# Patient Record
Sex: Female | Born: 1950 | Race: Black or African American | Hispanic: No | Smoking: Former smoker
Health system: Southern US, Community
[De-identification: ages and names within clinical notes are randomized; demographics above are authoritative.]

## PROBLEM LIST (undated history)

## (undated) DIAGNOSIS — E119 Type 2 diabetes mellitus without complications: Secondary | ICD-10-CM

## (undated) DIAGNOSIS — D649 Anemia, unspecified: Secondary | ICD-10-CM

## (undated) DIAGNOSIS — F329 Major depressive disorder, single episode, unspecified: Secondary | ICD-10-CM

## (undated) DIAGNOSIS — F32A Depression, unspecified: Secondary | ICD-10-CM

## (undated) DIAGNOSIS — R269 Unspecified abnormalities of gait and mobility: Secondary | ICD-10-CM

## (undated) HISTORY — DX: Type 2 diabetes mellitus without complications: E11.9

## (undated) HISTORY — DX: Anemia, unspecified: D64.9

## (undated) HISTORY — DX: Major depressive disorder, single episode, unspecified: F32.9

## (undated) HISTORY — DX: Depression, unspecified: F32.A

## (undated) HISTORY — DX: Unspecified abnormalities of gait and mobility: R26.9

---

## 2017-10-29 ENCOUNTER — Other Ambulatory Visit: Payer: Self-pay

## 2017-10-29 ENCOUNTER — Ambulatory Visit (INDEPENDENT_AMBULATORY_CARE_PROVIDER_SITE_OTHER): Payer: Medicare Other | Admitting: Neurology

## 2017-10-29 ENCOUNTER — Encounter (INDEPENDENT_AMBULATORY_CARE_PROVIDER_SITE_OTHER): Payer: Self-pay

## 2017-10-29 ENCOUNTER — Encounter: Payer: Self-pay | Admitting: Neurology

## 2017-10-29 VITALS — BP 98/60 | HR 87 | Ht 65.0 in | Wt 162.0 lb

## 2017-10-29 DIAGNOSIS — R202 Paresthesia of skin: Secondary | ICD-10-CM | POA: Diagnosis not present

## 2017-10-29 DIAGNOSIS — E538 Deficiency of other specified B group vitamins: Secondary | ICD-10-CM

## 2017-10-29 DIAGNOSIS — R269 Unspecified abnormalities of gait and mobility: Secondary | ICD-10-CM | POA: Diagnosis not present

## 2017-10-29 HISTORY — DX: Unspecified abnormalities of gait and mobility: R26.9

## 2017-10-29 NOTE — Progress Notes (Signed)
Reason for visit: Gait disturbance  Referring physician: Dr. Philmore Paliean  Natalie Howard is a 66 y.o. female  History of present illness:  Ms. Natalie Howard is a 66 year old right-handed black female with a history of problems with discomfort in the feet that has been present since 2012.  The patient indicates that it hurts to put pressure on her feet, but she also describes a numbness in the distal portion of the feet associated with a cramping sensation.  The pain is present at all times, but it seems to be a bit worse on the left foot than the right.  The pain does not keep her awake at night.  She does report low back pain without pain radiating from the back down the leg on either side.  The patient denies any numbness or discomfort in the hands, she does have some occasional neck pain.  She denies issues controlling the bowels of the bladder.  The patient has a history of alcohol use, but she claims that she was consuming only about 1 alcoholic beverage a day.  The patient last fell in September 2018, she has had several falls over time, she reports a history of a syncopal event in 2013.  She recently has moved to this area.  The patient claims that she has had nerve conduction studies previously several years ago and was told that she had a neuropathy.  The patient is sent to this office for further evaluation.  Past Medical History:  Diagnosis Date  . Depression   . Diabetes (HCC)   . Gait abnormality 10/29/2017    History reviewed. No pertinent surgical history.  Family History  Problem Relation Age of Onset  . Diabetes Mother   . Cancer - Colon Father   . Diabetes Sister   . Heart disease Brother   . Renal Disease Brother     Social history:  reports that she has quit smoking. she has never used smokeless tobacco. She reports that she does not drink alcohol or use drugs.  Medications:  Prior to Admission medications   Medication Sig Start Date End Date Taking? Authorizing Provider    ammonium lactate (AMLACTIN) 12 % cream Apply 1 Bottle as needed topically for dry skin.   Yes [provider]  ASPIRIN 81 PO Take 1 tablet daily by mouth.   Yes [provider]  ibuprofen (ADVIL,MOTRIN) 600 MG tablet Take 600 mg every 6 (six) hours as needed by mouth.   Yes [provider]  metformin (FORTAMET) 500 MG (OSM) 24 hr tablet Take 500 mg daily with breakfast by mouth.   Yes [provider]  PARoxetine (PAXIL) 10 MG tablet Take 10 mg daily by mouth.   Yes [provider]      Allergies  Allergen Reactions  . Epitol [Carbamazepine]   . Gluten Meal   . Mold Extract [Trichophyton]   . Penicillins     ROS:  Out of a complete 14 system review of symptoms, the patient complains only of the following symptoms, and all other reviewed systems are negative.  Ringing in the ears Numbness, passing out Depression  Blood pressure 98/60, pulse 87, height 5\' 5"  (1.651 m), weight 162 lb (73.5 kg).  Physical Exam  General: The patient is alert and cooperative at the time of the examination.  Eyes: Pupils are equal, round, and reactive to light. Discs are flat bilaterally.  Neck: The neck is supple, no carotid bruits are noted.  Respiratory: The respiratory examination  is clear.  Cardiovascular: The cardiovascular examination reveals a regular rate and rhythm, no obvious murmurs or rubs are noted.  Skin: Extremities are without significant edema.  Neurologic Exam  Mental status: The patient is alert and oriented x 3 at the time of the examination. The patient has apparent normal recent and remote memory, with an apparently normal attention span and concentration ability.  Cranial nerves: Facial symmetry is present. There is a decrease in pinprick sensation on the left face and forehead as compared to the right, the patient splits the midline with vibration sensation on the forehead, decreased on the right. The strength of the facial  muscles and the muscles to head turning and shoulder shrug are normal bilaterally. Speech is well enunciated, no aphasia or dysarthria is noted. Extraocular movements are full. Visual fields are full. The tongue is midline, and the patient has symmetric elevation of the soft palate. No obvious hearing deficits are noted.  Motor: The motor testing reveals 5 over 5 strength of all 4 extremities. Good symmetric motor tone is noted throughout.  Sensory: Sensory testing is intact to pinprick, soft touch, vibration sensation, and position sense on all 4 extremities, with exception that there is a decreased pinprick sensation on the left arm as compared to the right, symmetric in the legs. No evidence of extinction is noted.  Coordination: Cerebellar testing reveals good finger-nose-finger and heel-to-shin bilaterally.  Gait and station: Gait is normal. Tandem gait is normal. Romberg is negative. No drift is seen.  Reflexes: Deep tendon reflexes are symmetric, but are depressed bilaterally. Toes are downgoing bilaterally.   Assessment/Plan:  1.  Reported gait disturbance  2.  Nonorganic sensory examination  3. Prediabetes, HgB A1C is 6.1  The patient does have a nonorganic sensory examination, she splits the midline with vibration sensation on the forehead.  The patient reports some troubles with falling, her clinical examination today shows that her gait is relatively normal.  She does have decreased reflexes in the lower extremities.  We will set patient up for blood work today, she will have nerve conduction studies on both legs and EMG on the left leg.  She will follow-up for the above study.  Marlan Palau. Keith Willis MD 10/29/2017 11:01 AM  [facility] [address] [place], [place] [id number]  Phone [phone] Fax [phone]

## 2017-10-29 NOTE — Patient Instructions (Signed)
   WE will get blood work today and get EMG and NCV evaluation to look at the nerve function of the legs.

## 2017-11-02 LAB — MULTIPLE MYELOMA PANEL, SERUM
ALPHA2 GLOB SERPL ELPH-MCNC: 0.7 g/dL (ref 0.4–1.0)
Albumin SerPl Elph-Mcnc: 4 g/dL (ref 2.9–4.4)
Albumin/Glob SerPl: 1.4 (ref 0.7–1.7)
Alpha 1: 0.2 g/dL (ref 0.0–0.4)
B-Globulin SerPl Elph-Mcnc: 1.1 g/dL (ref 0.7–1.3)
Gamma Glob SerPl Elph-Mcnc: 0.9 g/dL (ref 0.4–1.8)
Globulin, Total: 2.9 g/dL (ref 2.2–3.9)
IGA/IMMUNOGLOBULIN A, SERUM: 232 mg/dL (ref 87–352)
IGG (IMMUNOGLOBIN G), SERUM: 868 mg/dL (ref 700–1600)
IGM (IMMUNOGLOBULIN M), SRM: 72 mg/dL (ref 26–217)
TOTAL PROTEIN: 6.9 g/dL (ref 6.0–8.5)

## 2017-11-02 LAB — ANA W/REFLEX: Anti Nuclear Antibody(ANA): NEGATIVE

## 2017-11-02 LAB — HIV ANTIBODY (ROUTINE TESTING W REFLEX): HIV SCREEN 4TH GENERATION: NONREACTIVE

## 2017-11-02 LAB — RPR: RPR: NONREACTIVE

## 2017-11-02 LAB — ANGIOTENSIN CONVERTING ENZYME: Angio Convert Enzyme: 43 U/L (ref 14–82)

## 2017-11-02 LAB — SEDIMENTATION RATE: Sed Rate: 10 mm/hr (ref 0–40)

## 2017-11-02 LAB — VITAMIN B12: VITAMIN B 12: 489 pg/mL (ref 232–1245)

## 2017-11-02 LAB — B. BURGDORFI ANTIBODIES: Lyme IgG/IgM Ab: <0.91 {ISR} (ref 0.00–0.90)

## 2017-11-03 ENCOUNTER — Telehealth: Payer: Self-pay | Admitting: *Deleted

## 2017-11-03 NOTE — Telephone Encounter (Signed)
-----   Message from York Spanielharles K Willis, MD sent at 11/02/2017  5:29 PM EST -----   The blood work results are unremarkable. Please call the patient.  ----- Message ----- From: Nell RangeInterface, [facility] Lab Results In Sent: 10/30/2017   7:40 AM To: York Spanielharles K Willis, MD

## 2017-11-03 NOTE — Telephone Encounter (Signed)
LVM for pt about unremarkable labs per CW,MD note.  Reminded her of EMG/NCS appt on 11/08/17.  Gave [facility] phone number if she has further questions/concerns.

## 2017-11-08 ENCOUNTER — Ambulatory Visit (INDEPENDENT_AMBULATORY_CARE_PROVIDER_SITE_OTHER): Payer: Medicare Other | Admitting: Neurology

## 2017-11-08 ENCOUNTER — Encounter: Payer: Self-pay | Admitting: Neurology

## 2017-11-08 DIAGNOSIS — R202 Paresthesia of skin: Secondary | ICD-10-CM

## 2017-11-08 DIAGNOSIS — M79604 Pain in right leg: Secondary | ICD-10-CM

## 2017-11-08 DIAGNOSIS — M79605 Pain in left leg: Secondary | ICD-10-CM | POA: Diagnosis not present

## 2017-11-08 DIAGNOSIS — R269 Unspecified abnormalities of gait and mobility: Secondary | ICD-10-CM

## 2017-11-08 NOTE — Procedures (Signed)
     HISTORY:  Natalie FullingDenise Howard is a 66 year old patient with a history of low back pain and leg discomfort, tingling and discomfort in the toes the feet bilaterally.  The patient is being evaluated for a possible neuropathy or a lumbosacral radiculopathy.  NERVE CONDUCTION STUDIES:  Nerve conduction studies were performed on both lower extremities.  Distal motor latencies for the peroneal and posterior tibial nerves were within normal limits bilaterally, there is evidence of normal motor amplitudes for the peroneal nerves bilaterally but low motor amplitudes for the posterior tibial nerves bilaterally.  The nerve conduction velocities for the posterior tibial nerves are normal bilaterally, slow below the knee only for the peroneal nerves bilaterally.  The peroneal sensory latencies are normal bilaterally, the H reflex latencies are within normal limits bilaterally.  EMG STUDIES:  EMG study was performed on the left lower extremity:  The tibialis anterior muscle reveals 2 to 4K motor units with slightly reduced recruitment. No fibrillations or positive waves were seen. The peroneus tertius muscle reveals 2 to 5K motor units with decreased recruitment. No fibrillations or positive waves were seen. The medial gastrocnemius muscle reveals 1 to 3K motor units with full recruitment. No fibrillations or positive waves were seen. The vastus lateralis muscle reveals up to 10K motor units with decreased recruitment. No fibrillations or positive waves were seen. The iliopsoas muscle reveals 2 to 4K motor units with slightly decreased recruitment. No fibrillations or positive waves were seen. The biceps femoris muscle (long head) reveals 2 to 4K motor units with full recruitment. No fibrillations or positive waves were seen. The lumbosacral paraspinal muscles were tested at 3 levels, and revealed no abnormalities of insertional activity at all 3 levels tested. There was good  relaxation.   IMPRESSION:  Nerve conduction studies done on both lower extremities do not confirm the presence of a peripheral neuropathy.  There is some lowering of motor amplitudes for the posterior tibial nerves bilaterally and slight slowing for the peroneal nerves below the knees bilaterally.  The EMG evaluation of the left lower extremity suggests mild chronic stable signs of denervation suggestive of an overlying L4 and L5 radiculopathy.  Clinical correlation is required.  Marlan Palau. Keith Willis MD 11/08/2017 11:23 AM  [facility] [address] [place], [place] [id number]  Phone [phone] Fax [phone]

## 2017-11-08 NOTE — Progress Notes (Addendum)
The patient comes in for EMG and nerve conduction study evaluation.  No definite signs of a peripheral neuropathy are noted, but the patient may have a mild overlying chronic L4 and L5 radiculopathy.  The patient will be sent for MRI of the lumbar spine.    MNC    Nerve / Sites Muscle Latency Ref. Amplitude Ref. Rel Amp Segments Distance Velocity Ref. Area    ms ms mV mV %  cm m/s m/s mVms  L Peroneal - EDB     Ankle EDB 5.3 ?6.5 3.0 ?2.0 100 Ankle - EDB 9   4.1     Fib head EDB 14.0  1.3  44.8 Fib head - Ankle 37 42 ?44 2.5     Pop fossa EDB 15.9  0.8  60.1 Pop fossa - Fib head 10 53 ?44 1.8         Pop fossa - Ankle      R Peroneal - EDB     Ankle EDB 4.3 ?6.5 4.7 ?2.0 100 Ankle - EDB 9   10.6     Fib head EDB 13.2  3.3  69.4 Fib head - Ankle 38 43 ?44 8.1     Pop fossa EDB 15.1  2.6  79.6 Pop fossa - Fib head 10 55 ?44 7.7         Pop fossa - Ankle      L Tibial - AH     Ankle AH 5.1 ?5.8 2.6 ?4.0 100 Ankle - AH 9   4.4     Pop fossa AH 15.8  0.3  12.9 Pop fossa - Ankle 444 412 ?41 0.8  R Tibial - AH     Ankle AH 5.8 ?5.8 1.9 ?4.0 100 Ankle - AH 9   3.5     Pop fossa AH 15.9  2.1  107 Pop fossa - Ankle 42 42 ?41 4.0             SNC    Nerve / Sites Rec. Site Peak Lat Ref.  Amp Ref. Segments Distance    ms ms V V  cm  R Superficial peroneal - Ankle     Lat leg Ankle 3.5 ?4.4 8 ?6 Lat leg - Ankle 14  L Superficial peroneal - Ankle     Lat leg Ankle 3.3 ?4.4 0.45 ?6 Lat leg - Ankle 14         H Reflex    Nerve H Lat Lat Hmax   ms ms   Left Right Ref. Left Right Ref.  Tibial - S[place]us 34.2 34.4 ?35.0 36.9 22.2 ?35.0         EMG

## 2017-11-08 NOTE — Progress Notes (Signed)
Please refer to EMG and nerve conduction study procedure note. 

## 2017-12-27 ENCOUNTER — Ambulatory Visit
Admission: RE | Admit: 2017-12-27 | Discharge: 2017-12-27 | Disposition: A | Discharge status: Home / Self Care | Payer: Medicare Other | Source: Ambulatory Visit | Attending: Neurology | Admitting: Neurology

## 2017-12-27 DIAGNOSIS — R269 Unspecified abnormalities of gait and mobility: Secondary | ICD-10-CM

## 2017-12-27 DIAGNOSIS — M79605 Pain in left leg: Secondary | ICD-10-CM

## 2017-12-27 DIAGNOSIS — M79604 Pain in right leg: Secondary | ICD-10-CM | POA: Diagnosis not present

## 2017-12-27 DIAGNOSIS — R202 Paresthesia of skin: Secondary | ICD-10-CM

## 2017-12-28 ENCOUNTER — Telehealth: Payer: Self-pay | Admitting: Neurology

## 2017-12-28 NOTE — Telephone Encounter (Signed)
  I called the patient.  MRI of the low back is relatively unremarkable, minimal degenerative changes are seen, no evidence of spinal stenosis or nerve root impingement.  Nothing to explain the low back pain or pain down the legs.  The foot pain could potentially be related to a small fiber neuropathy, nerve conduction studies did not show evidence of a peripheral neuropathy.  If the patient believes that she needs something for the discomfort, we can initiate gabapentin or Lyrica.  She will call our office if desired.   MRI lumbar 12/27/17:  IMPRESSION:    This MRI of the lumbar spine without contrast shows mild multilevel degenerative changes as detailed above that did not lead to any nerve root compression. There is no spinal stenosis. There were no acute findings.

## 2018-01-13 DIAGNOSIS — E119 Type 2 diabetes mellitus without complications: Secondary | ICD-10-CM | POA: Insufficient documentation

## 2018-01-13 DIAGNOSIS — Z7984 Long term (current) use of oral hypoglycemic drugs: Secondary | ICD-10-CM | POA: Insufficient documentation

## 2018-01-13 DIAGNOSIS — H2513 Age-related nuclear cataract, bilateral: Secondary | ICD-10-CM | POA: Insufficient documentation

## 2018-01-13 DIAGNOSIS — H43813 Vitreous degeneration, bilateral: Secondary | ICD-10-CM | POA: Insufficient documentation

## 2018-01-13 DIAGNOSIS — H524 Presbyopia: Secondary | ICD-10-CM | POA: Insufficient documentation

## 2018-11-30 ENCOUNTER — Ambulatory Visit (INDEPENDENT_AMBULATORY_CARE_PROVIDER_SITE_OTHER): Payer: Medicare Other | Admitting: Neurology

## 2018-11-30 DIAGNOSIS — R55 Syncope and collapse: Secondary | ICD-10-CM

## 2018-11-30 NOTE — Procedures (Signed)
    History:  Natalie Howard is a 67 year old patient with a history of chronic low back pain.  The patient has had a history of syncope in 2013, she has had recent episodes of falling.  The patient is being evaluated for this issue.  This is a routine EEG.  No skull defects are noted.  Medications include aspirin, metformin, and Paxil.  EEG classification: Normal awake  Description of the recording: The background rhythms of this recording consists of a fairly well modulated medium amplitude alpha rhythm of 9 Hz that is reactive to eye opening and closure. As the record progresses, the patient appears to remain in the waking state throughout the recording. Photic stimulation was performed, resulting in a bilateral and symmetric photic driving response. Hyperventilation was also performed, resulting in a minimal buildup of the background rhythm activities without significant slowing seen. At no time during the recording does there appear to be evidence of spike or spike wave discharges or evidence of focal slowing. EKG monitor shows no evidence of cardiac rhythm abnormalities with a heart rate of 60.  Impression: This is a normal EEG recording in the waking state. No evidence of ictal or interictal discharges are seen.

## 2018-12-05 ENCOUNTER — Encounter: Payer: Self-pay | Admitting: Internal Medicine

## 2019-09-19 ENCOUNTER — Other Ambulatory Visit: Payer: Self-pay | Admitting: Family Medicine

## 2019-09-19 DIAGNOSIS — Z1231 Encounter for screening mammogram for malignant neoplasm of breast: Secondary | ICD-10-CM

## 2019-10-02 DIAGNOSIS — M659 Synovitis and tenosynovitis, unspecified: Secondary | ICD-10-CM | POA: Insufficient documentation

## 2019-10-02 DIAGNOSIS — M65931 Unspecified synovitis and tenosynovitis, right forearm: Secondary | ICD-10-CM | POA: Insufficient documentation

## 2019-11-14 ENCOUNTER — Other Ambulatory Visit: Payer: Self-pay

## 2019-11-14 ENCOUNTER — Ambulatory Visit
Admission: RE | Admit: 2019-11-14 | Discharge: 2019-11-14 | Disposition: A | Discharge status: Home / Self Care | Payer: Medicare Other | Source: Ambulatory Visit | Attending: Family Medicine | Admitting: Family Medicine

## 2019-11-14 DIAGNOSIS — Z1231 Encounter for screening mammogram for malignant neoplasm of breast: Secondary | ICD-10-CM

## 2019-12-18 IMAGING — MG DIGITAL SCREENING BILAT W/ TOMO W/ CAD
8 series · 8 of 24 positions shown · non-contrast
Comparison: Previous exam(s).

CLINICAL DATA: Screening.

EXAM:
DIGITAL SCREENING BILATERAL MAMMOGRAM WITH TOMO AND CAD

[R CC synth-2D]
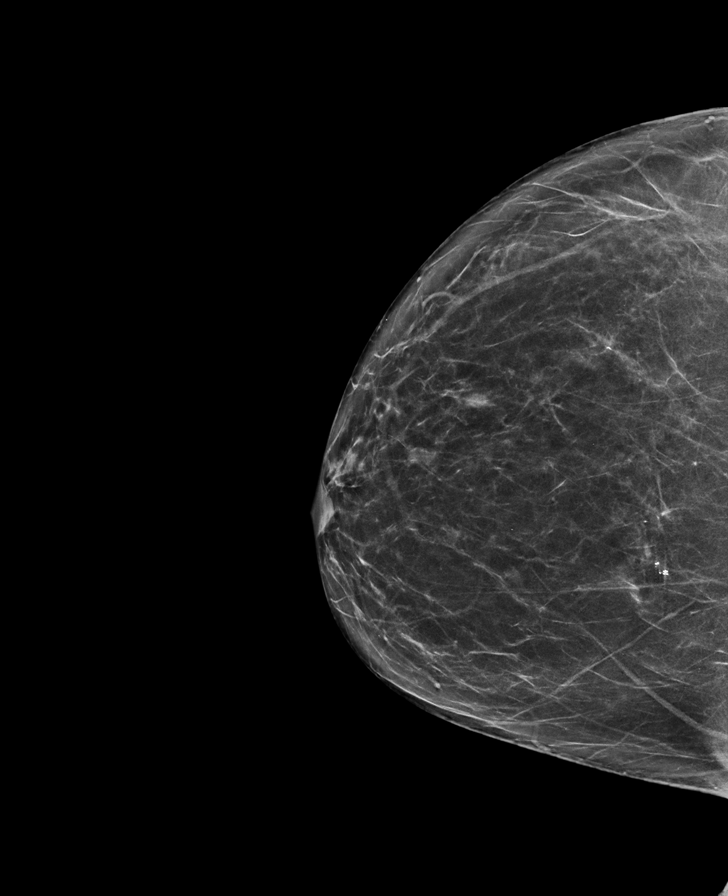

[L MLO synth-2D]
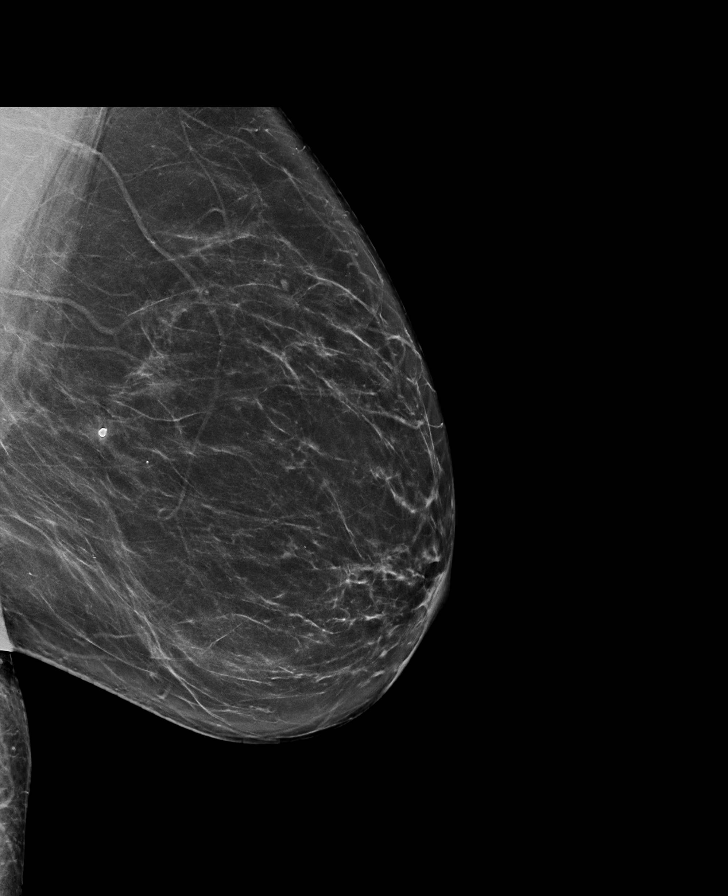

[L CC synth-2D]
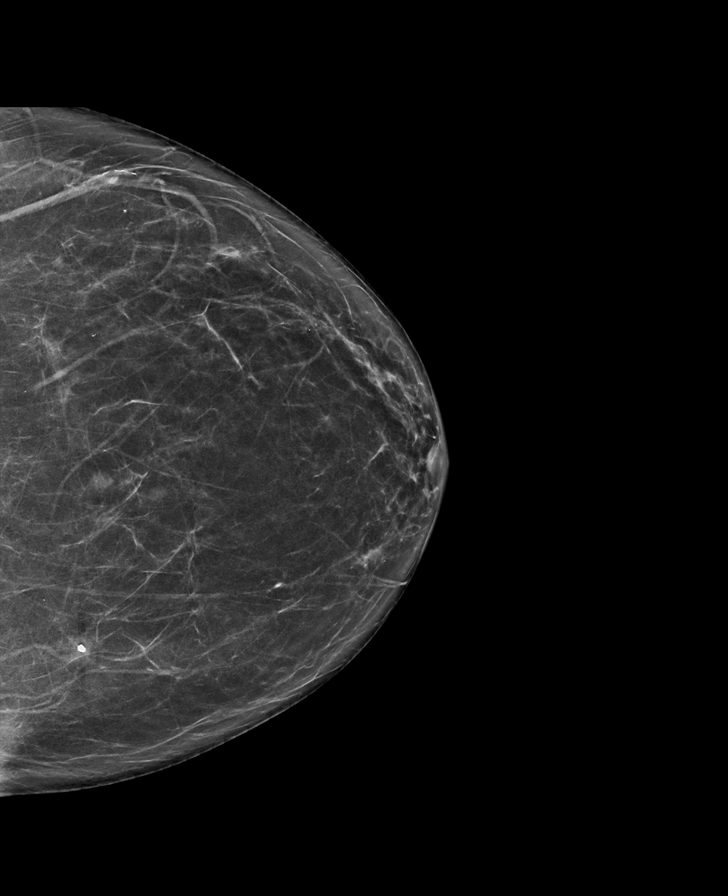

[R MLO synth-2D]
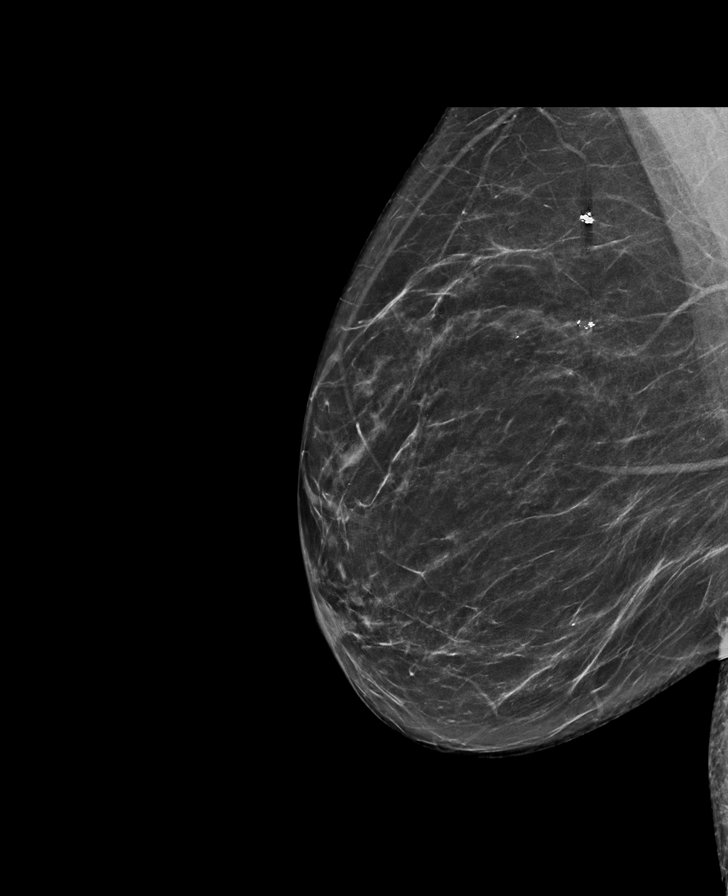

[R MLO tomo · tomo slice 37/72.0]
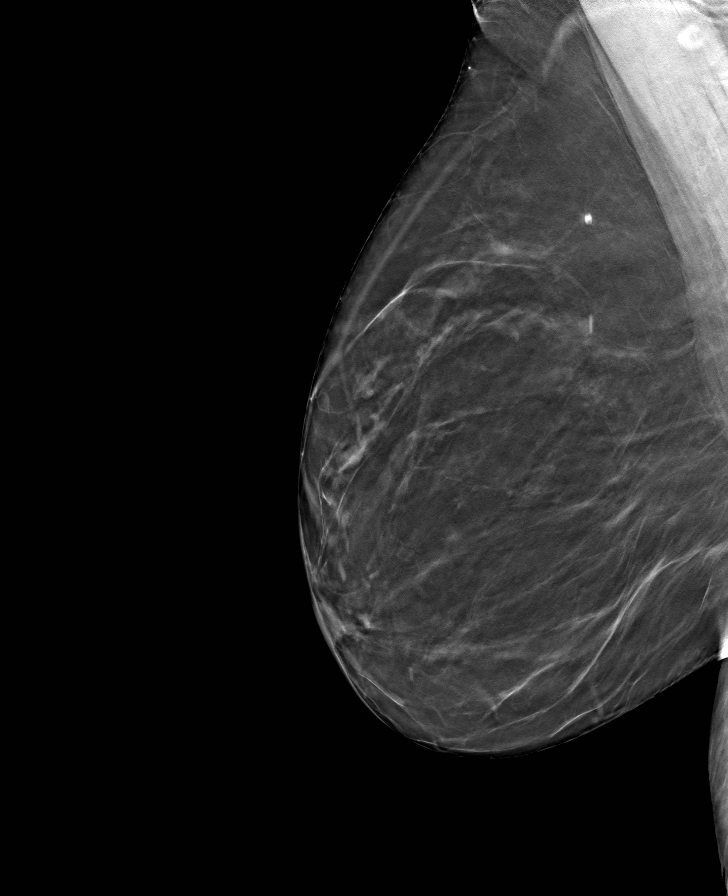

[L MLO tomo · tomo slice 40/79.0]
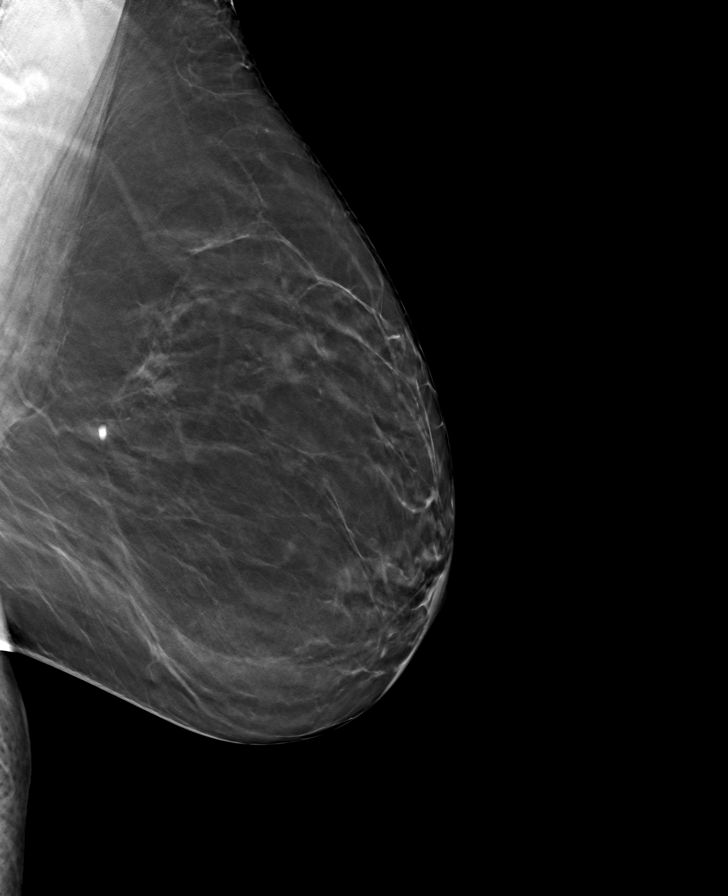

[R CC tomo · tomo slice 35/69.0]
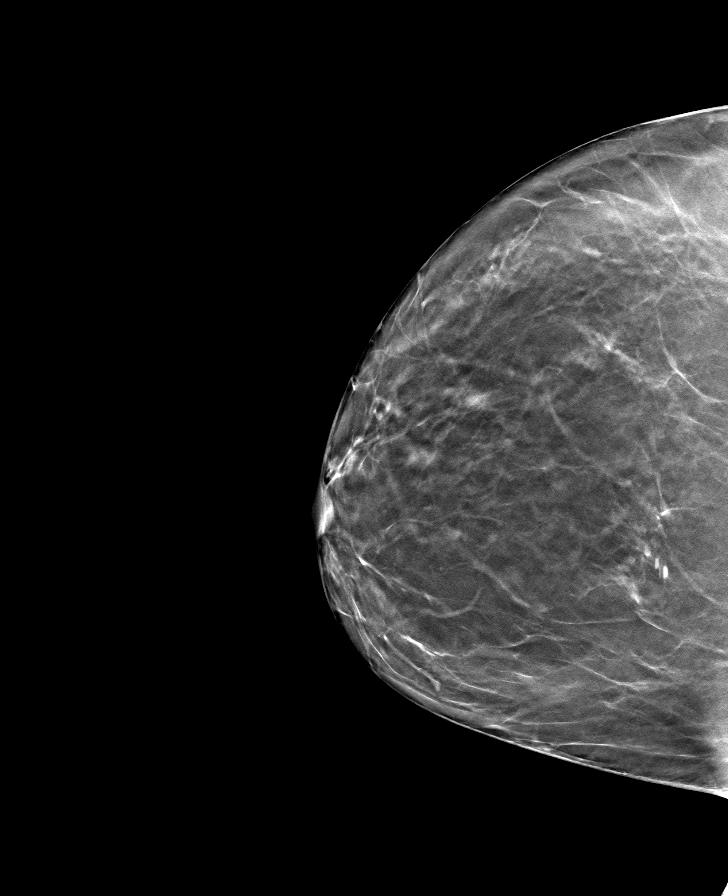

[L CC tomo · tomo slice 34/67.0]
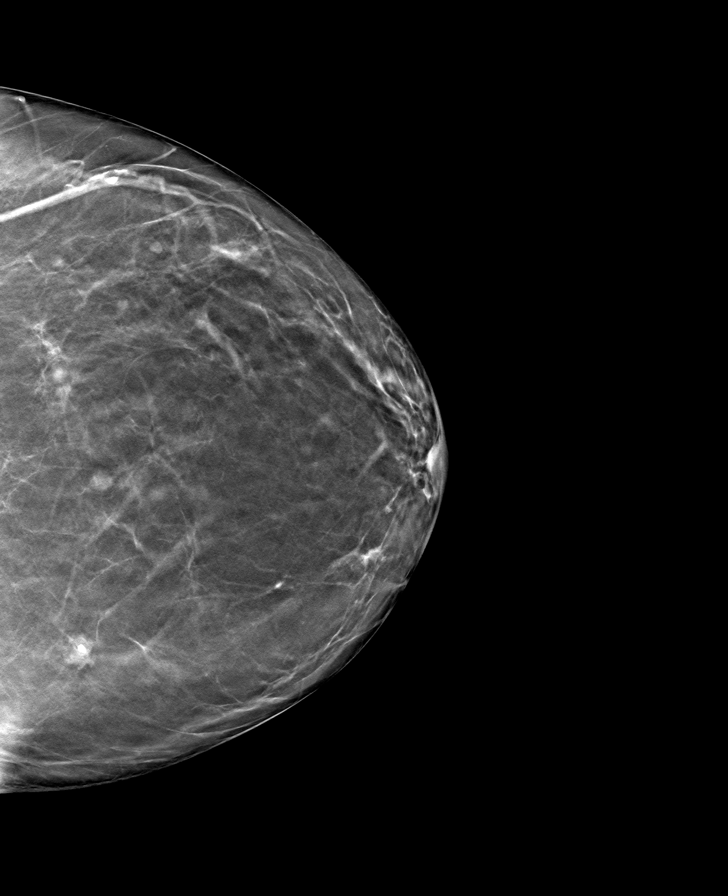

[8 of 24 positions shown; findings below may reference images not displayed]

ACR Breast Density Category b: There are scattered areas of
fibroglandular density.
FINDINGS: There are no findings suspicious for malignancy. Images were
processed with CAD.
IMPRESSION: No mammographic evidence of malignancy. A result letter of this
screening mammogram will be mailed directly to the patient.

RECOMMENDATION:
Screening mammogram in one year. (Code:[id number])

BI-RADS CATEGORY  1: Negative.

## 2020-04-25 ENCOUNTER — Inpatient Hospital Stay (HOSPITAL_BASED_OUTPATIENT_CLINIC_OR_DEPARTMENT_OTHER)
Admission: EM | Admit: 2020-04-25 | Discharge: 2020-05-01 | DRG: 378 | Disposition: A | Discharge status: Home / Self Care | Payer: Medicare Other | Attending: Family Medicine | Admitting: Family Medicine

## 2020-04-25 ENCOUNTER — Encounter (HOSPITAL_BASED_OUTPATIENT_CLINIC_OR_DEPARTMENT_OTHER): Payer: Self-pay | Admitting: *Deleted

## 2020-04-25 ENCOUNTER — Emergency Department (HOSPITAL_BASED_OUTPATIENT_CLINIC_OR_DEPARTMENT_OTHER): Payer: Medicare Other

## 2020-04-25 ENCOUNTER — Other Ambulatory Visit: Payer: Self-pay

## 2020-04-25 DIAGNOSIS — Z833 Family history of diabetes mellitus: Secondary | ICD-10-CM

## 2020-04-25 DIAGNOSIS — Z800 Family history of malignant neoplasm of digestive organs: Secondary | ICD-10-CM

## 2020-04-25 DIAGNOSIS — D62 Acute posthemorrhagic anemia: Secondary | ICD-10-CM | POA: Diagnosis present

## 2020-04-25 DIAGNOSIS — Z20822 Contact with and (suspected) exposure to covid-19: Secondary | ICD-10-CM | POA: Diagnosis present

## 2020-04-25 DIAGNOSIS — K5792 Diverticulitis of intestine, part unspecified, without perforation or abscess without bleeding: Secondary | ICD-10-CM | POA: Diagnosis present

## 2020-04-25 DIAGNOSIS — Z7984 Long term (current) use of oral hypoglycemic drugs: Secondary | ICD-10-CM

## 2020-04-25 DIAGNOSIS — K922 Gastrointestinal hemorrhage, unspecified: Secondary | ICD-10-CM | POA: Diagnosis present

## 2020-04-25 DIAGNOSIS — K5720 Diverticulitis of large intestine with perforation and abscess without bleeding: Secondary | ICD-10-CM

## 2020-04-25 DIAGNOSIS — K625 Hemorrhage of anus and rectum: Secondary | ICD-10-CM

## 2020-04-25 DIAGNOSIS — Z841 Family history of disorders of kidney and ureter: Secondary | ICD-10-CM

## 2020-04-25 DIAGNOSIS — Z8249 Family history of ischemic heart disease and other diseases of the circulatory system: Secondary | ICD-10-CM

## 2020-04-25 DIAGNOSIS — K529 Noninfective gastroenteritis and colitis, unspecified: Secondary | ICD-10-CM | POA: Diagnosis present

## 2020-04-25 DIAGNOSIS — K640 First degree hemorrhoids: Secondary | ICD-10-CM | POA: Diagnosis present

## 2020-04-25 DIAGNOSIS — K632 Fistula of intestine: Secondary | ICD-10-CM

## 2020-04-25 DIAGNOSIS — Z87891 Personal history of nicotine dependence: Secondary | ICD-10-CM

## 2020-04-25 DIAGNOSIS — Z888 Allergy status to other drugs, medicaments and biological substances status: Secondary | ICD-10-CM

## 2020-04-25 DIAGNOSIS — K5733 Diverticulitis of large intestine without perforation or abscess with bleeding: Secondary | ICD-10-CM | POA: Diagnosis not present

## 2020-04-25 DIAGNOSIS — Z7982 Long term (current) use of aspirin: Secondary | ICD-10-CM

## 2020-04-25 DIAGNOSIS — F419 Anxiety disorder, unspecified: Secondary | ICD-10-CM | POA: Diagnosis present

## 2020-04-25 DIAGNOSIS — Z880 Allergy status to penicillin: Secondary | ICD-10-CM

## 2020-04-25 DIAGNOSIS — E11649 Type 2 diabetes mellitus with hypoglycemia without coma: Secondary | ICD-10-CM | POA: Diagnosis not present

## 2020-04-25 DIAGNOSIS — Z79899 Other long term (current) drug therapy: Secondary | ICD-10-CM

## 2020-04-25 LAB — COMPREHENSIVE METABOLIC PANEL
ALT: 15 U/L (ref 0–44)
AST: 14 U/L — ABNORMAL LOW (ref 15–41)
Albumin: 3.9 g/dL (ref 3.5–5.0)
Alkaline Phosphatase: 91 U/L (ref 38–126)
Anion gap: 9 (ref 5–15)
BUN: 12 mg/dL (ref 8–23)
CO2: 25 mmol/L (ref 22–32)
Calcium: 9.1 mg/dL (ref 8.9–10.3)
Chloride: 108 mmol/L (ref 98–111)
Creatinine, Ser: 0.79 mg/dL (ref 0.44–1.00)
GFR calc Af Amer: >60 mL/min (ref >60)
GFR calc non Af Amer: >60 mL/min (ref >60)
Glucose, Bld: 127 mg/dL — ABNORMAL HIGH (ref 70–99)
Potassium: 3.9 mmol/L (ref 3.5–5.1)
Sodium: 142 mmol/L (ref 135–145)
Total Bilirubin: 0.5 mg/dL (ref 0.3–1.2)
Total Protein: 7.3 g/dL (ref 6.5–8.1)

## 2020-04-25 LAB — CBC
HCT: 35.1 % — ABNORMAL LOW (ref 36.0–46.0)
Hemoglobin: 11.5 g/dL — ABNORMAL LOW (ref 12.0–15.0)
MCH: 27.5 pg (ref 26.0–34.0)
MCHC: 32.8 g/dL (ref 30.0–36.0)
MCV: 84 fL (ref 80.0–100.0)
Platelets: 243 10*3/uL (ref 150–400)
RBC: 4.18 MIL/uL (ref 3.87–5.11)
RDW: 15.3 % (ref 11.5–15.5)
WBC: 5.5 10*3/uL (ref 4.0–10.5)
nRBC: 0 % (ref 0.0–0.2)

## 2020-04-25 LAB — OCCULT BLOOD X 1 CARD TO LAB, STOOL: Fecal Occult Bld: POSITIVE — AB

## 2020-04-25 NOTE — ED Notes (Signed)
Lying flat for orthostatics

## 2020-04-25 NOTE — ED Triage Notes (Addendum)
Abdominal pain and burgundy/bright red stools x 5 days. States the bleeding started after taking Simvastatin x 2. It has slowed since stopping the medication.

## 2020-04-26 ENCOUNTER — Encounter (HOSPITAL_BASED_OUTPATIENT_CLINIC_OR_DEPARTMENT_OTHER): Payer: Self-pay | Admitting: Emergency Medicine

## 2020-04-26 DIAGNOSIS — D62 Acute posthemorrhagic anemia: Secondary | ICD-10-CM | POA: Diagnosis present

## 2020-04-26 DIAGNOSIS — K625 Hemorrhage of anus and rectum: Secondary | ICD-10-CM | POA: Diagnosis present

## 2020-04-26 DIAGNOSIS — Z20822 Contact with and (suspected) exposure to covid-19: Secondary | ICD-10-CM | POA: Diagnosis present

## 2020-04-26 DIAGNOSIS — K5733 Diverticulitis of large intestine without perforation or abscess with bleeding: Secondary | ICD-10-CM | POA: Diagnosis present

## 2020-04-26 DIAGNOSIS — Z8249 Family history of ischemic heart disease and other diseases of the circulatory system: Secondary | ICD-10-CM | POA: Diagnosis not present

## 2020-04-26 DIAGNOSIS — Z7982 Long term (current) use of aspirin: Secondary | ICD-10-CM | POA: Diagnosis not present

## 2020-04-26 DIAGNOSIS — Z79899 Other long term (current) drug therapy: Secondary | ICD-10-CM | POA: Diagnosis not present

## 2020-04-26 DIAGNOSIS — Z7984 Long term (current) use of oral hypoglycemic drugs: Secondary | ICD-10-CM | POA: Diagnosis not present

## 2020-04-26 DIAGNOSIS — K640 First degree hemorrhoids: Secondary | ICD-10-CM | POA: Diagnosis present

## 2020-04-26 DIAGNOSIS — K5792 Diverticulitis of intestine, part unspecified, without perforation or abscess without bleeding: Secondary | ICD-10-CM

## 2020-04-26 DIAGNOSIS — K632 Fistula of intestine: Secondary | ICD-10-CM

## 2020-04-26 DIAGNOSIS — E11649 Type 2 diabetes mellitus with hypoglycemia without coma: Secondary | ICD-10-CM | POA: Diagnosis not present

## 2020-04-26 DIAGNOSIS — Z888 Allergy status to other drugs, medicaments and biological substances status: Secondary | ICD-10-CM | POA: Diagnosis not present

## 2020-04-26 DIAGNOSIS — K922 Gastrointestinal hemorrhage, unspecified: Secondary | ICD-10-CM | POA: Diagnosis present

## 2020-04-26 DIAGNOSIS — Z833 Family history of diabetes mellitus: Secondary | ICD-10-CM | POA: Diagnosis not present

## 2020-04-26 DIAGNOSIS — Z87891 Personal history of nicotine dependence: Secondary | ICD-10-CM | POA: Diagnosis not present

## 2020-04-26 DIAGNOSIS — Z800 Family history of malignant neoplasm of digestive organs: Secondary | ICD-10-CM | POA: Diagnosis not present

## 2020-04-26 DIAGNOSIS — K5720 Diverticulitis of large intestine with perforation and abscess without bleeding: Secondary | ICD-10-CM

## 2020-04-26 DIAGNOSIS — Z880 Allergy status to penicillin: Secondary | ICD-10-CM | POA: Diagnosis not present

## 2020-04-26 DIAGNOSIS — K529 Noninfective gastroenteritis and colitis, unspecified: Secondary | ICD-10-CM | POA: Diagnosis present

## 2020-04-26 DIAGNOSIS — Z841 Family history of disorders of kidney and ureter: Secondary | ICD-10-CM | POA: Diagnosis not present

## 2020-04-26 DIAGNOSIS — F419 Anxiety disorder, unspecified: Secondary | ICD-10-CM | POA: Diagnosis present

## 2020-04-26 LAB — SARS CORONAVIRUS 2 BY RT PCR (HOSPITAL ORDER, PERFORMED IN ~~LOC~~ HOSPITAL LAB): SARS Coronavirus 2: NEGATIVE

## 2020-04-26 LAB — BASIC METABOLIC PANEL
Anion gap: 6 (ref 5–15)
BUN: 10 mg/dL (ref 8–23)
CO2: 26 mmol/L (ref 22–32)
Calcium: 8.6 mg/dL — ABNORMAL LOW (ref 8.9–10.3)
Chloride: 110 mmol/L (ref 98–111)
Creatinine, Ser: 0.73 mg/dL (ref 0.44–1.00)
GFR calc Af Amer: >60 mL/min (ref >60)
GFR calc non Af Amer: >60 mL/min (ref >60)
Glucose, Bld: 82 mg/dL (ref 70–99)
Potassium: 4.3 mmol/L (ref 3.5–5.1)
Sodium: 142 mmol/L (ref 135–145)

## 2020-04-26 LAB — HEMOGLOBIN AND HEMATOCRIT, BLOOD
HCT: 32 % — ABNORMAL LOW (ref 36.0–46.0)
HCT: 30.8 % — ABNORMAL LOW (ref 36.0–46.0)
Hemoglobin: 10 g/dL — ABNORMAL LOW (ref 12.0–15.0)
Hemoglobin: 9.7 g/dL — ABNORMAL LOW (ref 12.0–15.0)

## 2020-04-26 LAB — GLUCOSE, CAPILLARY
Glucose-Capillary: 68 mg/dL — ABNORMAL LOW (ref 70–99)
Glucose-Capillary: 157 mg/dL — ABNORMAL HIGH (ref 70–99)
Glucose-Capillary: 62 mg/dL — ABNORMAL LOW (ref 70–99)

## 2020-04-26 MED ORDER — SODIUM CHLORIDE 0.9 % IV SOLN: INTRAVENOUS | Status: DC

## 2020-04-26 MED ORDER — IOHEXOL 300 MG/ML  SOLN
100.0000 mL | Freq: Once | INTRAMUSCULAR | Status: AC | PRN
  Administered 2020-04-26: 100 mL via INTRAVENOUS

## 2020-04-26 MED ORDER — PAROXETINE HCL 10 MG PO TABS
10.0000 mg | ORAL_TABLET | Freq: Every day | ORAL | Status: DC
  Administered 2020-04-26 – 2020-05-01 (×5): 10 mg via ORAL
  Filled 2020-04-26 (×6): qty 1

## 2020-04-26 MED ORDER — ONDANSETRON HCL 4 MG/2ML IJ SOLN
4.0000 mg | Freq: Once | INTRAMUSCULAR | Status: AC
  Administered 2020-04-26: 4 mg via INTRAVENOUS
  Filled 2020-04-26: qty 2

## 2020-04-26 MED ORDER — CIPROFLOXACIN IN D5W 400 MG/200ML IV SOLN
400.0000 mg | Freq: Two times a day (BID) | INTRAVENOUS | Status: DC
  Administered 2020-04-26 – 2020-05-01 (×10): 400 mg via INTRAVENOUS
  Filled 2020-04-26 (×10): qty 200

## 2020-04-26 MED ORDER — INSULIN ASPART 100 UNIT/ML ~~LOC~~ SOLN
0.0000 [IU] | Freq: Four times a day (QID) | SUBCUTANEOUS | Status: DC
  Administered 2020-04-29: 2 [IU] via SUBCUTANEOUS
  Administered 2020-05-01: 1 [IU] via SUBCUTANEOUS

## 2020-04-26 MED ORDER — INSULIN ASPART 100 UNIT/ML ~~LOC~~ SOLN: 0.0000 [IU] | Freq: Four times a day (QID) | SUBCUTANEOUS | Status: DC

## 2020-04-26 MED ORDER — METRONIDAZOLE IN NACL 5-0.79 MG/ML-% IV SOLN
500.0000 mg | Freq: Three times a day (TID) | INTRAVENOUS | Status: DC
  Administered 2020-04-26 – 2020-05-01 (×14): 500 mg via INTRAVENOUS
  Filled 2020-04-26 (×14): qty 100

## 2020-04-26 MED ORDER — DEXTROSE 50 % IV SOLN
12.5000 g | INTRAVENOUS | Status: AC
  Administered 2020-04-26: 12.5 g via INTRAVENOUS
  Filled 2020-04-26: qty 50

## 2020-04-26 MED ORDER — MORPHINE SULFATE (PF) 2 MG/ML IV SOLN
2.0000 mg | INTRAVENOUS | Status: DC | PRN
  Administered 2020-04-26 – 2020-05-01 (×29): 2 mg via INTRAVENOUS
  Filled 2020-04-26 (×29): qty 1

## 2020-04-26 MED ORDER — FENTANYL CITRATE (PF) 100 MCG/2ML IJ SOLN
50.0000 ug | Freq: Once | INTRAMUSCULAR | Status: AC
  Administered 2020-04-26: 50 ug via INTRAVENOUS
  Filled 2020-04-26: qty 2

## 2020-04-26 MED ORDER — MORPHINE SULFATE (PF) 2 MG/ML IV SOLN: 2.0000 mg | INTRAVENOUS | Status: DC | PRN

## 2020-04-26 MED ORDER — MORPHINE SULFATE (PF) 4 MG/ML IV SOLN
4.0000 mg | Freq: Once | INTRAVENOUS | Status: AC
  Administered 2020-04-26: 4 mg via INTRAVENOUS
  Filled 2020-04-26: qty 1

## 2020-04-26 MED ORDER — ACETAMINOPHEN 325 MG PO TABS
650.0000 mg | ORAL_TABLET | Freq: Four times a day (QID) | ORAL | Status: DC | PRN
  Administered 2020-04-27 – 2020-04-30 (×6): 650 mg via ORAL
  Filled 2020-04-26 (×6): qty 2

## 2020-04-26 MED ORDER — ACETAMINOPHEN 650 MG RE SUPP: 650.0000 mg | Freq: Four times a day (QID) | RECTAL | Status: DC | PRN

## 2020-04-26 NOTE — ED Notes (Signed)
Call placed to Daughter per pt request to update on care and report bed assignment to [facility] [id number]

## 2020-04-26 NOTE — Consult Note (Signed)
Natalie Howard 1951/01/09  [id number].    Requesting MD: Dr. Nicanor Alcon Chief Complaint/Reason for Consult: Colocolic fistula  HPI: Natalie Howard is a 69 y.o. female with a history of diabetes who presented to [facility] with abdominal pain, nausea and bloody stools.  Patient reports that last week she thought that she was having stomachaches.  She describes this as a intermittent, dull pain in her left lower quadrant.  She believes this was from a new medication that she had recently started.  On Saturday she began having a more severe, constant pain in her left lower quadrant that she describes as achiness with radiation of a severe pain to her left flank.  She notes that later that night she began having bowel movements with BRB.  She reports that the blood was mucousy and jellylike, streaked in the stool. No associated fever, chills, emesis, diarrhea, pneumaturia, flecks of stool and urine or other urinary symptoms.  She continued to have bloody bowel movements throughout the week which prompted her to present to the ED on the night of 5/13.  In the ED patient's vital signs are without fever, tachycardia, tachypnea, hypoxia or hypotension.  WBC 5.5.  Hemoglobin 11.5.  Fecal occult positive.  CT A/P showed abnormal wall thickening within a segment of the mid sigmoid colon, with evidence of underlying colocolic fistula. There was also borderline enlarged lymph nodes within the left external iliac distribution that was nonspecific.  Patient was admitted to the hospitalist service and transferred to [facility] for further evaluation.  We are asked to see.  Patient reports that her bloody BMs have almost resolved.  Her last bowel movement approximately 4 hours ago had only a small "fleck" of blood noted in it.  Her vital signs have been stable since admission and without any tachycardia or hypotension.  The patient denies history of diverticulitis in the past.  She denies history of  similar abdominal pain in the past or history of several days of abdominal pain that was not evaluated.  She reports that her last colonoscopy was 3-4 years ago and [place], [place] and she believes there is no abnormalities.  She denies any family history of Crohn's or ulcerative colitis.  She reports she thinks her father had colon cancer but is unsure of this.  Patient is not on blood thinners.  Prior abdominal surgeries include C-section and tubal ligation.  ROS: Review of Systems  Constitutional: Negative for chills and fever.  Respiratory: Negative for cough and shortness of breath.   Cardiovascular: Negative for chest pain and leg swelling.  Gastrointestinal: Positive for abdominal pain, blood in stool and nausea. Negative for constipation, diarrhea, melena and vomiting.  Genitourinary: Negative for dysuria.  Musculoskeletal: Positive for back pain.  Psychiatric/Behavioral: Negative for substance abuse.    Family History  Problem Relation Age of Onset  . Diabetes Mother   . Cancer - Colon Father   . Diabetes Sister   . Heart disease Brother   . Renal Disease Brother     Past Medical History:  Diagnosis Date  . Depression   . Diabetes (HCC)   . Gait abnormality 10/29/2017    History reviewed. No pertinent surgical history.  Social History:  reports that she has quit smoking. She has never used smokeless tobacco. She reports that she does not drink alcohol or use drugs.   Allergies:  Allergies  Allergen Reactions  . Epitol [Carbamazepine]   . Gluten Meal   . Mold Extract [  Trichophyton]   . Penicillins     Medications Prior to Admission  Medication Sig Dispense Refill  . ammonium lactate (AMLACTIN) 12 % cream Apply 1 Bottle as needed topically for dry skin.    . ASPIRIN 81 PO Take 1 tablet daily by mouth.    Marland Kitchen ibuprofen (ADVIL,MOTRIN) 600 MG tablet Take 600 mg every 6 (six) hours as needed by mouth.    . metformin (FORTAMET) 500 MG (OSM) 24 hr tablet Take 500 mg daily with  breakfast by mouth.    Marland Kitchen PARoxetine (PAXIL) 10 MG tablet Take 10 mg daily by mouth.       Physical Exam: Blood pressure 109/76, pulse 65, temperature 98.1 F (36.7 C), temperature source Oral, resp. rate 18, height 5\' 5"  (1.651 m), weight 73.5 kg, SpO2 100 %. General: pleasant, WD/WN female who is laying in bed in NAD HEENT: head is normocephalic, atraumatic.  Sclera are noninjected.  PERRL.  Ears and nose without any masses or lesions.  Mouth is pink and moist. Dentition fair Heart: regular, rate, and rhythm.  Normal s1,s2. No obvious murmurs, gallops, or rubs noted.  Palpable pedal pulses bilaterally  Lungs: CTAB, no wheezes, rhonchi, or rales noted.  Respiratory effort nonlabored Abd: Soft, ND, tenderness of the LLQ>LUQ without r/r/g. No peritonitis. +BS, no masses, hernias, or organomegaly MS: no BUE/BLE edema, calves soft and nontender Skin: warm and dry with no masses, lesions, or rashes Psych: A&Ox4 with an appropriate affect Neuro: cranial nerves grossly intact, equal strength in BUE/BLE bilaterally, normal speech, though process intact   Results for orders placed or performed during the hospital encounter of 04/25/20 (from the past 48 hour(s))  Comprehensive metabolic panel     Status: Abnormal   Collection Time: 04/25/20 10:31 PM  Result Value Ref Range   Sodium 142 135 - 145 mmol/L   Potassium 3.9 3.5 - 5.1 mmol/L   Chloride 108 98 - 111 mmol/L   CO2 25 22 - 32 mmol/L   Glucose, Bld 127 (H) 70 - 99 mg/dL    Comment: Glucose reference range applies only to samples taken after fasting for at least 8 hours.   BUN 12 8 - 23 mg/dL   Creatinine, Ser 04/27/20 0.44 - 1.00 mg/dL   Calcium 9.1 8.9 - 7.85 mg/dL   Total Protein 7.3 6.5 - 8.1 g/dL   Albumin 3.9 3.5 - 5.0 g/dL   AST 14 (L) 15 - 41 U/L   ALT 15 0 - 44 U/L   Alkaline Phosphatase 91 38 - 126 U/L   Total Bilirubin 0.5 0.3 - 1.2 mg/dL   GFR calc non Af Amer >60 >60 mL/min   GFR calc Af Amer >60 >60 mL/min   Anion gap 9 5  - 15    Comment: Performed at [facility], [address] [place], [place]  CBC     Status: Abnormal   Collection Time: 04/25/20 10:31 PM  Result Value Ref Range   WBC 5.5 4.0 - 10.5 K/uL   RBC 4.18 3.87 - 5.11 MIL/uL   Hemoglobin 11.5 (L) 12.0 - 15.0 g/dL   HCT 04/27/20 (L) 12.8 - 78.6 %   MCV 84.0 80.0 - 100.0 fL   MCH 27.5 26.0 - 34.0 pg   MCHC 32.8 30.0 - 36.0 g/dL   RDW 76.7 20.9 - 47.0 %   Platelets 243 150 - 400 K/uL   nRBC 0.0 0.0 - 0.2 %    Comment: Performed at  [facility], [address] [place], [place] [address]  Occult blood card to lab, stool     Status: Abnormal   Collection Time: 04/25/20 11:30 PM  Result Value Ref Range   Fecal Occult Bld POSITIVE (A) NEGATIVE    Comment: Performed at [facility], [address] [place], [place] [address]  SARS Coronavirus 2 by RT PCR (hospital order, performed in [facility] hospital lab) Nasopharyngeal Nasopharyngeal Swab     Status: None   Collection Time: 04/26/20 12:34 AM   Specimen: Nasopharyngeal Swab  Result Value Ref Range   SARS Coronavirus 2 NEGATIVE NEGATIVE    Comment: (NOTE) SARS-CoV-2 target nucleic acids are NOT DETECTED. The SARS-CoV-2 RNA is generally detectable in upper and lower respiratory specimens during the acute phase of infection. The lowest concentration of SARS-CoV-2 viral copies this assay can detect is 250 copies / mL. A negative result does not preclude SARS-CoV-2 infection and should not be used as the sole basis for treatment or other patient management decisions.  A negative result may occur with improper specimen collection / handling, submission of specimen other than nasopharyngeal swab, presence of viral mutation(s) within the areas targeted by this assay, and inadequate number of viral copies (<250 copies / mL). A negative result must be combined with clinical observations, patient history, and epidemiological information. Fact Sheet for  Patients:   StrictlyIdeas.no Fact Sheet for Healthcare Providers: BankingDealers.co.za This test is not yet approved or cleared  by the [place] FDA and has been authorized for detection and/or diagnosis of SARS-CoV-2 by FDA under an Emergency Use Authorization (EUA).  This EUA will remain in effect (meaning this test can be used) for the duration of the COVID-19 declaration under Section 564(b)(1) of the Act, 21 U.S.C. section 360bbb-3(b)(1), unless the authorization is terminated or revoked sooner. Performed at [facility], [address] [place], [place] [address]    CT ABDOMEN PELVIS W CONTRAST  Result Date: 04/26/2020 CLINICAL DATA:  Abdominal pain, bright red blood in stool for 5 days EXAM: CT ABDOMEN AND PELVIS WITH CONTRAST TECHNIQUE: Multidetector CT imaging of the abdomen and pelvis was performed using the standard protocol following bolus administration of intravenous contrast. CONTRAST:  180mL OMNIPAQUE IOHEXOL 300 MG/ML  SOLN COMPARISON:  None. FINDINGS: Lower chest: No acute pleural or parenchymal lung disease. Hepatobiliary: No focal liver abnormality is seen. No gallstones, gallbladder wall thickening, or biliary dilatation. Pancreas: Unremarkable. No pancreatic ductal dilatation or surrounding inflammatory changes. Spleen: Normal in size without focal abnormality. Adrenals/Urinary Tract: Adrenal glands are unremarkable. Kidneys are normal, without renal calculi, focal lesion, or hydronephrosis. Bladder is unremarkable. Stomach/Bowel: There is diffuse diverticulosis of the descending and sigmoid colon. There is eccentric wall thickening involving the anti mesenteric wall of the mid sigmoid colon. 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 This can best be seen on sagittal image 68. No bowel obstruction or ileus. Normal appendix is  seen in the right lower quadrant. Moderate stool throughout the colon. Vascular/Lymphatic: Mild aortic atherosclerosis is noted. There are 2 borderline enlarged lymph nodes within the left lower quadrant at the level of the left iliac bifurcation, reference image 49. Largest lymph node measures 9 mm. Reproductive: Uterus and bilateral adnexa are unremarkable. Other: There is no free fluid or free gas. No abdominal wall hernia. Musculoskeletal: No acute or destructive bony lesions. Reconstructed images demonstrate no additional findings. IMPRESSION:  1. Abnormal wall thickening within a segment of the mid sigmoid colon, with evidence of underlying colocolic fistula. The appearance could reflect sequela from prior bouts of diverticulitis, though colonoscopy is recommended to exclude underlying neoplasm. 2. Borderline enlarged lymph nodes within the left external iliac distribution, nonspecific. Electronically Signed   By: Sharlet Salina M.D.   On: 04/26/2020 00:28    Anti-infectives (From admission, onward)   Start     Dose/Rate Route Frequency Ordered Stop   04/26/20 1545  ciprofloxacin (CIPRO) IVPB 400 mg     400 mg 200 mL/hr over 60 Minutes Intravenous Every 12 hours 04/26/20 1541     04/26/20 1545  metroNIDAZOLE (FLAGYL) IVPB 500 mg     500 mg 100 mL/hr over 60 Minutes Intravenous Every 8 hours 04/26/20 1541        Assessment/Plan DM  Inflammation of the sigmoid colon on CT Colocolic fistula ABL Anemia -No indication for emergency surgery at this time -Hgb 11.5. VSS. She reports GI bleed is resolving -Etiology of CT scan is unclear at this time.  Likely from diverticulitis but other etiologies cannot be excluded.  Recommend GI consultation to determine if colonoscopy during hospitalization is appropriate -N.p.o., bowel rest, IV antibiotics -We will continue to follow.  FEN - NPO VTE - SCDs ID - Cipro/Flagyl  Jacinto Halim, [facility] 04/26/2020, 3:49 PM Please  see Amion for pager number during day hours 7:00am-4:30pm

## 2020-04-26 NOTE — ED Notes (Signed)
IV infiltrated during morphine administration however pt notes relief of pain. IV d/c'd and EDP notified.

## 2020-04-26 NOTE — ED Notes (Addendum)
Updated daughter Galena via telephone with patients permission. Daughter would like update via telephone when patient gets bed assignment.

## 2020-04-26 NOTE — ED Notes (Signed)
Dr Lynelle Doctor made aware of pt increase in pain, will place orders. Pt awaiting inpatient bed placement

## 2020-04-26 NOTE — ED Notes (Signed)
Pt more comfortable, awaiting inpatient bed assignment.

## 2020-04-26 NOTE — ED Notes (Signed)
Ambulated to bathroom with steady gait

## 2020-04-26 NOTE — H&P (Signed)
History and Physical    Natalie Howard ZOX:[id number] DOB: 1951-04-21 DOA: 04/25/2020  PCP: Gwenyth Bender, MD   [facility]: [facility]  Chief Complaint: rectal bleeding  HPI: Natalie Howard is a 69 y.o. female with medical history significant for patient, diabetes mellitus presented to the [facility] last night with complaint of nausea, abdominal pain and bright red/burgundy colored stool for 5 days.  Patient reports that bleeding started after taking simvastatin x2 and had associated abdominal pain on mid to left abdomen and radiation to back.  It was described dull in nature, which is more severe on Saturday and constant.  For the past year she has been having intermittent bouts of "stomach upset" with gas and abdominal discomfort mostly on the left side but was unable to localize until yesterday.  She had a colonoscopy about 3 years ago while she was at new work and apparently was normal. Patient otherwise denies any vomiting, chest pain, shortness of breath, fever, chills, headache, focal weakness, numbness tingling, speech difficulties.  In the ED, patient was hemodynamically stable H&H showed hemoglobin 11.5 g no prior labs for comparison, BMP normal, occult blood was positive, Covid 19 swab negative.  CT abdomen pelvis with contrast showed abnormal wall thickening within a segment of mid sigmoid colon with evidence of underlying colocolic fistula Dr. Carolynne Edouard from surgery was consulted and patient was transferred to [facility] for admission under medicine service. When I saw her in medical floor she is resting has some pain on mid epigastrium and left abdomen. She is on RA.  Review of Systems: All systems were reviewed and were negative except as mentioned in HPI above. Negative for fever Negative for chest pain Negative for shortness of breath  Past Medical History:  Diagnosis Date  . Depression   . Diabetes (HCC)   . Gait  abnormality 10/29/2017    History reviewed. No pertinent surgical history.   reports that she has quit smoking. She has never used smokeless tobacco. She reports that she does not drink alcohol or use drugs.  Allergies  Allergen Reactions  . Epitol [Carbamazepine]   . Gluten Meal   . Mold Extract [Trichophyton]   . Penicillins     Family History  Problem Relation Age of Onset  . Diabetes Mother   . Cancer - Colon Father   . Diabetes Sister   . Heart disease Brother   . Renal Disease Brother      Prior to Admission medications   Medication Sig Start Date End Date Taking? Authorizing Provider  ammonium lactate (AMLACTIN) 12 % cream Apply 1 Bottle as needed topically for dry skin.    [provider]  ASPIRIN 81 PO Take 1 tablet daily by mouth.    [provider]  ibuprofen (ADVIL,MOTRIN) 600 MG tablet Take 600 mg every 6 (six) hours as needed by mouth.    [provider]  metformin (FORTAMET) 500 MG (OSM) 24 hr tablet Take 500 mg daily with breakfast by mouth.    [provider]  PARoxetine (PAXIL) 10 MG tablet Take 10 mg daily by mouth.    [provider]    Physical Exam: Vitals:   04/26/20 1145 04/26/20 1254 04/26/20 1340 04/26/20 1434  BP:  122/74 114/75 109/76  Pulse: 71 64 63 65  Resp:  16 20 18   Temp:    98.1 F (36.7 C)  TempSrc:    Oral  SpO2: 99% 98%  100% 100%  Weight:      Height:        General exam: AAOx3, obese,NAD, weak appearing. HEENT:Oral mucosa moist, Ear/Nose WNL grossly, dentition normal. Respiratory system: bilaterally clear,no wheezing or crackles,no use of accessory muscle Cardiovascular system: S1 & S2 +, No JVD,. Gastrointestinal system: Abdomen soft,Tender to mid central/epigastric area and left abdomen,ND, BS+ Nervous System:Alert, awake, moving extremities and grossly nonfocal Extremities: No edema, distal peripheral pulses palpable.  Skin: No rashes,no icterus. MSK: Normal muscle bulk,tone,  power   Labs on Admission: I have personally reviewed following labs and imaging studies  CBC: Recent Labs  Lab 04/25/20 2231  WBC 5.5  HGB 11.5*  HCT 35.1*  MCV 84.0  PLT 623   Basic Metabolic Panel: Recent Labs  Lab 04/25/20 2231  NA 142  K 3.9  CL 108  CO2 25  GLUCOSE 127*  BUN 12  CREATININE 0.79  CALCIUM 9.1   GFR: Estimated Creatinine Clearance: 68.5 mL/min (by C-G formula based on SCr of 0.79 mg/dL). Liver Function Tests: Recent Labs  Lab 04/25/20 2231  AST 14*  ALT 15  ALKPHOS 91  BILITOT 0.5  PROT 7.3  ALBUMIN 3.9   No results for input(s): LIPASE, AMYLASE in the last 168 hours. No results for input(s): AMMONIA in the last 168 hours. Coagulation Profile: No results for input(s): INR, PROTIME in the last 168 hours. Cardiac Enzymes: No results for input(s): CKTOTAL, CKMB, CKMBINDEX, TROPONINI in the last 168 hours. BNP (last 3 results) No results for input(s): PROBNP in the last 8760 hours. HbA1C: No results for input(s): HGBA1C in the last 72 hours. CBG: No results for input(s): GLUCAP in the last 168 hours. Lipid Profile: No results for input(s): CHOL, HDL, LDLCALC, TRIG, CHOLHDL, LDLDIRECT in the last 72 hours. Thyroid Function Tests: No results for input(s): TSH, T4TOTAL, FREET4, T3FREE, THYROIDAB in the last 72 hours. Anemia Panel: No results for input(s): VITAMINB12, FOLATE, FERRITIN, TIBC, IRON, RETICCTPCT in the last 72 hours. Urine analysis: No results found for: COLORURINE, APPEARANCEUR, LABSPEC, PHURINE, GLUCOSEU, HGBUR, BILIRUBINUR, KETONESUR, PROTEINUR, UROBILINOGEN, NITRITE, LEUKOCYTESUR  Radiological Exams on Admission: CT ABDOMEN PELVIS W CONTRAST  Result Date: 04/26/2020 CLINICAL DATA:  Abdominal pain, bright red blood in stool for 5 days EXAM: CT ABDOMEN AND PELVIS WITH CONTRAST TECHNIQUE: Multidetector CT imaging of the abdomen and pelvis was performed using the standard protocol following bolus administration of intravenous  contrast. CONTRAST:  11mL OMNIPAQUE IOHEXOL 300 MG/ML  SOLN COMPARISON:  None. FINDINGS: Lower chest: No acute pleural or parenchymal lung disease. Hepatobiliary: No focal liver abnormality is seen. No gallstones, gallbladder wall thickening, or biliary dilatation. Pancreas: Unremarkable. No pancreatic ductal dilatation or surrounding inflammatory changes. Spleen: Normal in size without focal abnormality. Adrenals/Urinary Tract: Adrenal glands are unremarkable. Kidneys are normal, without renal calculi, focal lesion, or hydronephrosis. Bladder is unremarkable. Stomach/Bowel: There is diffuse diverticulosis of the descending and sigmoid colon. There is eccentric wall thickening involving the anti mesenteric wall of the mid sigmoid colon. 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 This can best be seen on sagittal image 68. No bowel obstruction or ileus. Normal appendix is seen in the right lower quadrant. Moderate stool throughout the colon. Vascular/Lymphatic: Mild aortic atherosclerosis is noted. There are 2 borderline enlarged lymph nodes within the left lower quadrant at the level of the left iliac bifurcation, reference image 49. Largest lymph node measures 9 mm. Reproductive: Uterus and bilateral adnexa  are unremarkable. Other: There is no free fluid or free gas. No abdominal wall hernia. Musculoskeletal: No acute or destructive bony lesions. Reconstructed images demonstrate no additional findings. IMPRESSION: 1. Abnormal wall thickening within a segment of the mid sigmoid colon, with evidence of underlying colocolic fistula. The appearance could reflect sequela from prior bouts of diverticulitis, though colonoscopy is recommended to exclude underlying neoplasm. 2. Borderline enlarged lymph nodes within the left external iliac distribution, nonspecific. Electronically Signed   By: Sharlet Salina M.D.   On: 04/26/2020 00:28     Assessment/Plan  Sigmoid colon deverticulitis with colocolinic fistula, CT scan shows abnormal wall thickening within the segment of the mid sigmoid colon with underlying colocolic fistula representing prior bouts of diverticulitis. CCS is on consult. Will start cipro/flagyl to treat sigmoid diverticulitis. CCS is on consult-recommends GI consultation to help for possible colonoscopy please consult GI in the morning.  N.p.o., continue IV morphine for pain control, continue Zofran.  Acute GI bleeding likely from diverticulitis:With CT abdomen showing colocolonic fistula and abnormal wall thickening within the segment of the mid sigmoid colon.H/H stable.Check serial H/H.  We will start the patient on Cipro and Flagyl.  She has penicillin allergy.  Npo with chips and meds, ivf, pain control with iv morphine. Hold aspirin/Lovenox or heparin.  Type 2 diabetes mellitus on insulin and Metformin.  Blood sugar 127 stable, check hemoglobin A1c. Hold oral medication add sliding scale insulin for now.  Anxiety on Paxil  Body mass index is 26.96 kg/m.   Severity of Illness: The appropriate patient status for this patient is inpatient. Inpatient status is judged to be reasonable and necessary in order to provide the required intensity of service to ensure the patient's safety. The patient's presenting symptoms, physical exam findings, and initial radiographic and laboratory data in the context of their medical condition is felt to place them at increased risk for further clinical deterioration. Furthermore, it is anticipated that the patient's length of stay will be more than 2 midnights due  to her diverticulitis colocolonic fistula, anemia and needing further service facility consultation and hemoglobin monitoring.   DVT prophylaxis:  SCD Code Status: Full code Family Communication:Admission, patients condition and plan of care including tests being ordered have been discussed with the patient who  indicate understanding and agree with the plan and Code Status.  Consults called:  CCS  Lanae Boast MD [facility]  If 7PM-7AM, please contact night-coverage [email or web address]  04/26/2020, 4:30 PM

## 2020-04-26 NOTE — ED Provider Notes (Signed)
[facility] EMERGENCY DEPARTMENT Provider Note   CSN: [id number] Arrival date & time: 04/25/20  2056     History Chief Complaint  Patient presents with  . Abdominal Pain  . Rectal Bleeding    Natalie Howard is a 69 y.o. female.  The history is provided by the patient.  Rectal Bleeding Quality:  Bright red Amount:  Moderate Duration:  7 days Timing:  Intermittent Chronicity:  New Context: defecation   Context: not anal fissures   Similar prior episodes: no   Relieved by:  Nothing Worsened by:  Nothing Ineffective treatments:  None tried Associated symptoms: abdominal pain   Associated symptoms: no dizziness, no fever, no loss of consciousness and no vomiting   Risk factors: no anticoagulant use and no hx of IBD   Patient presents with episodic rectal bleeding and lower abdominal pain since Saturday.  No f/c/r. No trauma.       Past Medical History:  Diagnosis Date  . Depression   . Diabetes (HCC)   . Gait abnormality 10/29/2017    Patient Active Problem List   Diagnosis Date Noted  . Gait abnormality 10/29/2017  . Paresthesia 10/29/2017    History reviewed. No pertinent surgical history.   OB History   No obstetric history on file.     Family History  Problem Relation Age of Onset  . Diabetes Mother   . Cancer - Colon Father   . Diabetes Sister   . Heart disease Brother   . Renal Disease Brother     Social History   Tobacco Use  . Smoking status: Former Games developer  . Smokeless tobacco: Never Used  Substance Use Topics  . Alcohol use: No    Comment: Hx alcohol abuse  . Drug use: No    Home Medications Prior to Admission medications   Medication Sig Start Date End Date Taking? Authorizing Provider  ammonium lactate (AMLACTIN) 12 % cream Apply 1 Bottle as needed topically for dry skin.    [provider]  ASPIRIN 81 PO Take 1 tablet daily by mouth.    [provider]  ibuprofen (ADVIL,MOTRIN) 600 MG tablet  Take 600 mg every 6 (six) hours as needed by mouth.    [provider]  metformin (FORTAMET) 500 MG (OSM) 24 hr tablet Take 500 mg daily with breakfast by mouth.    [provider]  PARoxetine (PAXIL) 10 MG tablet Take 10 mg daily by mouth.    [provider]    Allergies    Epitol [carbamazepine], Gluten meal, Mold extract [trichophyton], and Penicillins  Review of Systems   Review of Systems  Constitutional: Negative for fever.  HENT: Negative for congestion.   Eyes: Negative for visual disturbance.  Respiratory: Negative for shortness of breath.   Cardiovascular: Negative for chest pain.  Gastrointestinal: Positive for abdominal pain, anal bleeding, blood in stool and hematochezia. Negative for vomiting.  Genitourinary: Negative for dysuria.  Musculoskeletal: Negative for arthralgias.  Neurological: Negative for dizziness and loss of consciousness.  Psychiatric/Behavioral: Negative for agitation.  All other systems reviewed and are negative.   Physical Exam Updated Vital Signs BP 101/67 (BP Location: Right Arm)   Pulse 77   Temp 98.9 F (37.2 C) (Oral)   Resp 15   Ht 5\' 5"  (1.651 m)   Wt 73.5 kg   SpO2 100%   BMI 26.96 kg/m   Physical Exam Vitals and nursing note reviewed.  Constitutional:      General: She  is not in acute distress.    Appearance: She is well-developed.  HENT:     Head: Normocephalic and atraumatic.     Nose: Nose normal.  Eyes:     Conjunctiva/sclera: Conjunctivae normal.     Pupils: Pupils are equal, round, and reactive to light.  Cardiovascular:     Rate and Rhythm: Normal rate and regular rhythm.     Pulses: Normal pulses.     Heart sounds: Normal heart sounds.  Pulmonary:     Effort: Pulmonary effort is normal.     Breath sounds: Normal breath sounds.  Abdominal:     General: Abdomen is flat. Bowel sounds are normal. There is no distension.     Palpations: Abdomen is soft. There is no mass.     Tenderness:  There is no guarding or rebound.     Hernia: No hernia is present.  Genitourinary:    Rectum: Guaiac result positive.     Comments: Chaperone present Musculoskeletal:        General: Normal range of motion.     Cervical back: Normal range of motion and neck supple.  Skin:    General: Skin is warm and dry.     Capillary Refill: Capillary refill takes less than 2 seconds.  Neurological:     General: No focal deficit present.     Mental Status: She is alert and oriented to person, place, and time.     Deep Tendon Reflexes: Reflexes normal.  Psychiatric:        Mood and Affect: Mood normal.        Behavior: Behavior normal.     ED Results / Procedures / Treatments   Labs (all labs ordered are listed, but only abnormal results are displayed) Results for orders placed or performed during the hospital encounter of 04/25/20  SARS Coronavirus 2 by RT PCR (hospital order, performed in [facility] hospital lab) Nasopharyngeal Nasopharyngeal Swab   Specimen: Nasopharyngeal Swab  Result Value Ref Range   SARS Coronavirus 2 NEGATIVE NEGATIVE  Comprehensive metabolic panel  Result Value Ref Range   Sodium 142 135 - 145 mmol/L   Potassium 3.9 3.5 - 5.1 mmol/L   Chloride 108 98 - 111 mmol/L   CO2 25 22 - 32 mmol/L   Glucose, Bld 127 (H) 70 - 99 mg/dL   BUN 12 8 - 23 mg/dL   Creatinine, Ser 6.25 0.44 - 1.00 mg/dL   Calcium 9.1 8.9 - 63.8 mg/dL   Total Protein 7.3 6.5 - 8.1 g/dL   Albumin 3.9 3.5 - 5.0 g/dL   AST 14 (L) 15 - 41 U/L   ALT 15 0 - 44 U/L   Alkaline Phosphatase 91 38 - 126 U/L   Total Bilirubin 0.5 0.3 - 1.2 mg/dL   GFR calc non Af Amer >60 >60 mL/min   GFR calc Af Amer >60 >60 mL/min   Anion gap 9 5 - 15  CBC  Result Value Ref Range   WBC 5.5 4.0 - 10.5 K/uL   RBC 4.18 3.87 - 5.11 MIL/uL   Hemoglobin 11.5 (L) 12.0 - 15.0 g/dL   HCT 93.7 (L) 34.2 - 87.6 %   MCV 84.0 80.0 - 100.0 fL   MCH 27.5 26.0 - 34.0 pg   MCHC 32.8 30.0 - 36.0 g/dL   RDW 81.1 57.2 - 62.0 %    Platelets 243 150 - 400 K/uL   nRBC 0.0 0.0 - 0.2 %  Occult blood card to lab,  stool  Result Value Ref Range   Fecal Occult Bld POSITIVE (A) NEGATIVE   CT ABDOMEN PELVIS W CONTRAST  Result Date: 04/26/2020 CLINICAL DATA:  Abdominal pain, bright red blood in stool for 5 days EXAM: CT ABDOMEN AND PELVIS WITH CONTRAST TECHNIQUE: Multidetector CT imaging of the abdomen and pelvis was performed using the standard protocol following bolus administration of intravenous contrast. CONTRAST:  174mL OMNIPAQUE IOHEXOL 300 MG/ML  SOLN COMPARISON:  None. FINDINGS: Lower chest: No acute pleural or parenchymal lung disease. Hepatobiliary: No focal liver abnormality is seen. No gallstones, gallbladder wall thickening, or biliary dilatation. Pancreas: Unremarkable. No pancreatic ductal dilatation or surrounding inflammatory changes. Spleen: Normal in size without focal abnormality. Adrenals/Urinary Tract: Adrenal glands are unremarkable. Kidneys are normal, without renal calculi, focal lesion, or hydronephrosis. Bladder is unremarkable. Stomach/Bowel: There is diffuse diverticulosis of the descending and sigmoid colon. There is eccentric wall thickening involving the anti mesenteric wall of the mid sigmoid colon. 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 This can best be seen on sagittal image 68. No bowel obstruction or ileus. Normal appendix is seen in the right lower quadrant. Moderate stool throughout the colon. Vascular/Lymphatic: Mild aortic atherosclerosis is noted. There are 2 borderline enlarged lymph nodes within the left lower quadrant at the level of the left iliac bifurcation, reference image 49. Largest lymph node measures 9 mm. Reproductive: Uterus and bilateral adnexa are unremarkable. Other: There is no free fluid or free gas. No abdominal wall hernia. Musculoskeletal: No acute or destructive bony lesions. Reconstructed  images demonstrate no additional findings. IMPRESSION: 1. Abnormal wall thickening within a segment of the mid sigmoid colon, with evidence of underlying colocolic fistula. The appearance could reflect sequela from prior bouts of diverticulitis, though colonoscopy is recommended to exclude underlying neoplasm. 2. Borderline enlarged lymph nodes within the left external iliac distribution, nonspecific. Electronically Signed   By: Randa Ngo M.D.   On: 04/26/2020 00:28    Radiology CT ABDOMEN PELVIS W CONTRAST  Result Date: 04/26/2020 CLINICAL DATA:  Abdominal pain, bright red blood in stool for 5 days EXAM: CT ABDOMEN AND PELVIS WITH CONTRAST TECHNIQUE: Multidetector CT imaging of the abdomen and pelvis was performed using the standard protocol following bolus administration of intravenous contrast. CONTRAST:  123mL OMNIPAQUE IOHEXOL 300 MG/ML  SOLN COMPARISON:  None. FINDINGS: Lower chest: No acute pleural or parenchymal lung disease. Hepatobiliary: No focal liver abnormality is seen. No gallstones, gallbladder wall thickening, or biliary dilatation. Pancreas: Unremarkable. No pancreatic ductal dilatation or surrounding inflammatory changes. Spleen: Normal in size without focal abnormality. Adrenals/Urinary Tract: Adrenal glands are unremarkable. Kidneys are normal, without renal calculi, focal lesion, or hydronephrosis. Bladder is unremarkable. Stomach/Bowel: There is diffuse diverticulosis of the descending and sigmoid colon. There is eccentric wall thickening involving the anti mesenteric wall of the mid sigmoid colon. 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 This can best be seen on sagittal image 68. No bowel obstruction or ileus. Normal appendix is seen in the right lower quadrant. Moderate stool throughout the colon. Vascular/Lymphatic: Mild aortic atherosclerosis is noted. There are 2 borderline enlarged lymph  nodes within the left lower quadrant at the level of the left iliac bifurcation, reference image 49. Largest lymph node measures 9 mm. Reproductive: Uterus and bilateral adnexa are unremarkable. Other: There is no free fluid or free gas. No abdominal wall hernia. Musculoskeletal: No acute or  destructive bony lesions. Reconstructed images demonstrate no additional findings. IMPRESSION: 1. Abnormal wall thickening within a segment of the mid sigmoid colon, with evidence of underlying colocolic fistula. The appearance could reflect sequela from prior bouts of diverticulitis, though colonoscopy is recommended to exclude underlying neoplasm. 2. Borderline enlarged lymph nodes within the left external iliac distribution, nonspecific. Electronically Signed   By: Sharlet Salina M.D.   On: 04/26/2020 00:28    Procedures Procedures (including critical care time)  Medications Ordered in ED Medications  iohexol (OMNIPAQUE) 300 MG/ML solution 100 mL (100 mLs Intravenous Contrast Given 04/26/20 0009)  saline   ED Course  I have reviewed the triage vital signs and the nursing notes.  Pertinent labs & imaging results that were available during my care of the patient were reviewed by me and considered in my medical decision making (see chart for details).    1230 case d/w Dr. Carolynne Edouard.  No indication for surgical intervention at this time. Please have medicine consult post work up as necessary.     Final Clinical Impression(s) / ED Diagnoses Final diagnoses:  Rectal bleeding  Colonic fistula   Will admit to medicine for rectal bleeding and fistula.      Layan Zalenski, MD 04/26/20 [id number]

## 2020-04-27 DIAGNOSIS — K922 Gastrointestinal hemorrhage, unspecified: Secondary | ICD-10-CM

## 2020-04-27 DIAGNOSIS — K632 Fistula of intestine: Secondary | ICD-10-CM

## 2020-04-27 LAB — HEMOGLOBIN AND HEMATOCRIT, BLOOD
HCT: 29.4 % — ABNORMAL LOW (ref 36.0–46.0)
Hemoglobin: 9.5 g/dL — ABNORMAL LOW (ref 12.0–15.0)

## 2020-04-27 LAB — COMPREHENSIVE METABOLIC PANEL
ALT: 14 U/L (ref 0–44)
AST: 14 U/L — ABNORMAL LOW (ref 15–41)
Albumin: 3.5 g/dL (ref 3.5–5.0)
Alkaline Phosphatase: 82 U/L (ref 38–126)
Anion gap: 9 (ref 5–15)
BUN: 9 mg/dL (ref 8–23)
CO2: 23 mmol/L (ref 22–32)
Calcium: 8.3 mg/dL — ABNORMAL LOW (ref 8.9–10.3)
Chloride: 109 mmol/L (ref 98–111)
Creatinine, Ser: 0.85 mg/dL (ref 0.44–1.00)
GFR calc Af Amer: >60 mL/min (ref >60)
GFR calc non Af Amer: >60 mL/min (ref >60)
Glucose, Bld: 101 mg/dL — ABNORMAL HIGH (ref 70–99)
Potassium: 3.9 mmol/L (ref 3.5–5.1)
Sodium: 141 mmol/L (ref 135–145)
Total Bilirubin: 0.4 mg/dL (ref 0.3–1.2)
Total Protein: 6.5 g/dL (ref 6.5–8.1)

## 2020-04-27 LAB — CBC
HCT: 32.8 % — ABNORMAL LOW (ref 36.0–46.0)
Hemoglobin: 10.2 g/dL — ABNORMAL LOW (ref 12.0–15.0)
MCH: 26.8 pg (ref 26.0–34.0)
MCHC: 31.1 g/dL (ref 30.0–36.0)
MCV: 86.3 fL (ref 80.0–100.0)
Platelets: 221 10*3/uL (ref 150–400)
RBC: 3.8 MIL/uL — ABNORMAL LOW (ref 3.87–5.11)
RDW: 15.2 % (ref 11.5–15.5)
WBC: 6.2 10*3/uL (ref 4.0–10.5)
nRBC: 0 % (ref 0.0–0.2)

## 2020-04-27 LAB — GLUCOSE, CAPILLARY
Glucose-Capillary: 75 mg/dL (ref 70–99)
Glucose-Capillary: 80 mg/dL (ref 70–99)
Glucose-Capillary: 84 mg/dL (ref 70–99)
Glucose-Capillary: 99 mg/dL (ref 70–99)

## 2020-04-27 LAB — HEMOGLOBIN A1C
Hgb A1c MFr Bld: 6.4 % — ABNORMAL HIGH (ref 4.8–5.6)
Mean Plasma Glucose: 136.98 mg/dL

## 2020-04-27 LAB — HIV ANTIBODY (ROUTINE TESTING W REFLEX): HIV Screen 4th Generation wRfx: NONREACTIVE

## 2020-04-27 MED ORDER — SODIUM CHLORIDE 0.9 % IV SOLN: INTRAVENOUS | Status: DC

## 2020-04-27 NOTE — Progress Notes (Signed)
Subjective/Chief Complaint: Having abdominal pain this AM.  Generally tolerable IF she gets her pain meds on time, but if not, the pain starts to ramp up quickly.  Had a stool yesterday.    Has had a colonoscopy around 2-3 years ago in [place].     Objective: Vital signs in last 24 hours: Temp:  [98.1 F (36.7 C)-99 F (37.2 C)] 99 F (37.2 C) (05/15 0540) Pulse Rate:  [63-88] 88 (05/15 0540) Resp:  [14-20] 18 (05/15 0540) BP: (108-122)/(57-76) 111/69 (05/15 0540) SpO2:  [94 %-100 %] 94 % (05/15 0540) Last BM Date: 04/26/20  Intake/Output from previous day: 05/14 0701 - 05/15 0700 In: 2611.2 [I.V.:2011.1; IV Piggyback:600.1] Out: -  Intake/Output this shift: No intake/output data recorded.  General appearance: alert, cooperative and no distress Resp: breathing comfortably Cardio: RR&R GI: soft, mildly distended.  mildly tender in suprapubic region.  no HSM.  no r/g.   Extremities: extremities normal, atraumatic, no cyanosis or edema  Lab Results:  Recent Labs    04/25/20 2231 04/26/20 1600 04/26/20 2314 04/27/20 0820  WBC 5.5  --   --  6.2  HGB 11.5*   < > 9.7* 10.2*  HCT 35.1*   < > 30.8* 32.8*  PLT 243  --   --  221   < > = values in this interval not displayed.   BMET Recent Labs    04/25/20 2231 04/26/20 1600  NA 142 142  K 3.9 4.3  CL 108 110  CO2 25 26  GLUCOSE 127* 82  BUN 12 10  CREATININE 0.79 0.73  CALCIUM 9.1 8.6*   PT/INR No results for input(s): LABPROT, INR in the last 72 hours. ABG No results for input(s): PHART, HCO3 in the last 72 hours.  Invalid input(s): PCO2, PO2  Studies/Results: CT ABDOMEN PELVIS W CONTRAST  Result Date: 04/26/2020 CLINICAL DATA:  Abdominal pain, bright red blood in stool for 5 days EXAM: CT ABDOMEN AND PELVIS WITH CONTRAST TECHNIQUE: Multidetector CT imaging of the abdomen and pelvis was performed using the standard protocol following bolus administration of intravenous contrast. CONTRAST:  1105mL OMNIPAQUE  IOHEXOL 300 MG/ML  SOLN COMPARISON:  None. FINDINGS: Lower chest: No acute pleural or parenchymal lung disease. Hepatobiliary: No focal liver abnormality is seen. No gallstones, gallbladder wall thickening, or biliary dilatation. Pancreas: Unremarkable. No pancreatic ductal dilatation or surrounding inflammatory changes. Spleen: Normal in size without focal abnormality. Adrenals/Urinary Tract: Adrenal glands are unremarkable. Kidneys are normal, without renal calculi, focal lesion, or hydronephrosis. Bladder is unremarkable. Stomach/Bowel: There is diffuse diverticulosis of the descending and sigmoid colon. There is eccentric wall thickening involving the anti mesenteric wall of the mid sigmoid colon. 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 This can best be seen on sagittal image 68. No bowel obstruction or ileus. Normal appendix is seen in the right lower quadrant. Moderate stool throughout the colon. Vascular/Lymphatic: Mild aortic atherosclerosis is noted. There are 2 borderline enlarged lymph nodes within the left lower quadrant at the level of the left iliac bifurcation, reference image 49. Largest lymph node measures 9 mm. Reproductive: Uterus and bilateral adnexa are unremarkable. Other: There is no free fluid or free gas. No abdominal wall hernia. Musculoskeletal: No acute or destructive bony lesions. Reconstructed images demonstrate no additional findings. IMPRESSION: 1. Abnormal wall thickening within a segment of the mid sigmoid colon, with evidence of underlying colocolic fistula. The appearance could reflect sequela from prior  bouts of diverticulitis, though colonoscopy is recommended to exclude underlying neoplasm. 2. Borderline enlarged lymph nodes within the left external iliac distribution, nonspecific. Electronically Signed   By: Sharlet Salina M.D.   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  Sigmoid colitis.  IV fluids, NPO, IV antibiotics, pain control Rec GI consult for consideration of flex sig to eval source of bleeding and whether fistula is present.      LOS: 1 day    Almond Lint 04/27/2020

## 2020-04-27 NOTE — Progress Notes (Signed)
[facility]  PROGRESS NOTE  Natalie Howard QIW:[id number] DOB: 07-06-1951 DOA: 04/25/2020 PCP: Gwenyth Bender, MD   Brief HPI:   69 year old female with history of diabetes mellitus type 2, presents with complaints of abdominal pain, bright red burgundy colored stool for 5 days. In the ED CT abdomen pelvis showed abdominal wall thickening within a segment of mid sigmoid colon with evidence of underlying colocolic fistula. Dr. Carolynne Edouard was consulted and recommended transfer to [facility] under medicine service.    Subjective   Patient seen and examined, complains of left lower quadrant abdominal pain. Denies nausea or vomiting.   Assessment/Plan:     1. 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 General surgery was consulted, they are recommending sigmoidoscopy versus colonoscopy per GI to check for fistula and also rule out tumor. No plans for emergent surgery during this hospitalization. Will consult gastroenterology for further recommendations. 2. Acute GI bleeding likely from diverticulitis-CT abdomen showed colocolonic fistula with abdominal thickening. Patient started on Cipro and Flagyl. As needed IV morphine. Will hold chemical DVT prophylaxis due to GI bleed at this time. 3. Diabetes mellitus type 2-patient is sliding scale insulin. Metformin on hold. 4. Anxiety-continue Paxil.     SpO2: 97 %   COVID-19 Labs  No results for input(s): DDIMER, FERRITIN, LDH, CRP in the last 72 hours.  Lab Results  Component Value Date   SARSCOV2NAA NEGATIVE 04/26/2020     CBG: Recent Labs  Lab 04/26/20 1744 04/26/20 2024 04/27/20 0016 04/27/20 0650 04/27/20 1142  GLUCAP 157* 62* 84 99 80    CBC: Recent Labs  Lab 04/25/20 2231 04/26/20 1600 04/26/20 2314 04/27/20 0820  WBC 5.5  --   --  6.2  HGB  11.5* 10.0* 9.7* 10.2*  HCT 35.1* 32.0* 30.8* 32.8*  MCV 84.0  --   --  86.3  PLT 243  --   --  221    Basic Metabolic Panel: Recent Labs  Lab 04/25/20 2231 04/26/20 1600 04/27/20 0820  NA 142 142 141  K 3.9 4.3 3.9  CL 108 110 109  CO2 25 26 23   GLUCOSE 127* 82 101*  BUN 12 10 9   CREATININE 0.79 0.73 0.85  CALCIUM 9.1 8.6* 8.3*     Liver Function Tests: Recent Labs  Lab 04/25/20 2231 04/27/20 0820  AST 14* 14*  ALT 15 14  ALKPHOS 91 82  BILITOT 0.5 0.4  PROT 7.3 6.5  ALBUMIN 3.9 3.5        DVT prophylaxis: SCDs  Code Status: Full code  Family Communication: Discussed with patient's daughter at bedside  Disposition Plan:   Status is: Inpatient  Dispo: The patient is from: Home              Anticipated d/c is to: Home              Anticipated d/c date is: 04/29/2020              Patient currently admitted with sigmoid: Diverticulitis with colocolonic fistula. Barrier to discharge-pending GI evaluation. No plans for surgery in the hospital at this time.        Scheduled medications:  . insulin aspart  0-9 Units Subcutaneous Q6H  . PARoxetine  10 mg Oral Daily    Consultants:  General surgery  Procedures:    Antibiotics:   Anti-infectives (From admission, onward)  Start     Dose/Rate Route Frequency Ordered Stop   04/26/20 1545  ciprofloxacin (CIPRO) IVPB 400 mg     400 mg 200 mL/hr over 60 Minutes Intravenous Every 12 hours 04/26/20 1541     04/26/20 1545  metroNIDAZOLE (FLAGYL) IVPB 500 mg     500 mg 100 mL/hr over 60 Minutes Intravenous Every 8 hours 04/26/20 1541         Objective   Vitals:   04/26/20 1811 04/26/20 2023 04/27/20 0540 04/27/20 1313  BP: 108/67 (!) 117/57 111/69 124/77  Pulse: 67 78 88 82  Resp: 14 18 18 16   Temp: 98.2 F (36.8 C) 98.6 F (37 C) 99 F (37.2 C) 99.2 F (37.3 C)  TempSrc: Oral Oral Oral Oral  SpO2: 98% 98% 94% 97%  Weight:      Height:        Intake/Output Summary (Last 24 hours)  at 04/27/2020 1321 Last data filed at 04/27/2020 1030 Gross per 24 hour  Intake 2731.2 ml  Output --  Net 2731.2 ml    05/13 1901 - 05/15 0700 In: 2611.2 [I.V.:2011.1] Out: -   Filed Weights   04/25/20 2117  Weight: 73.5 kg    Physical Examination:   General-appears in no acute distress Heart-S1-S2, regular, no murmur auscultated Lungs-clear to auscultation bilaterally, no wheezing or crackles auscultated Abdomen-soft, left lower quadrant tenderness to palpation Extremities-no edema in the lower extremities Neuro-alert, oriented x3, no focal deficit noted   Data Reviewed:   Recent Results (from the past 240 hour(s))  SARS Coronavirus 2 by RT PCR (hospital order, performed in [facility] hospital lab) Nasopharyngeal Nasopharyngeal Swab     Status: None   Collection Time: 04/26/20 12:34 AM   Specimen: Nasopharyngeal Swab  Result Value Ref Range Status   SARS Coronavirus 2 NEGATIVE NEGATIVE Final    Comment: (NOTE) SARS-CoV-2 target nucleic acids are NOT DETECTED. The SARS-CoV-2 RNA is generally detectable in upper and lower respiratory specimens during the acute phase of infection. The lowest concentration of SARS-CoV-2 viral copies this assay can detect is 250 copies / mL. A negative result does not preclude SARS-CoV-2 infection and should not be used as the sole basis for treatment or other patient management decisions.  A negative result may occur with improper specimen collection / handling, submission of specimen other than nasopharyngeal swab, presence of viral mutation(s) within the areas targeted by this assay, and inadequate number of viral copies (<250 copies / mL). A negative result must be combined with clinical observations, patient history, and epidemiological information. Fact Sheet for Patients:   04/28/20 Fact Sheet for Healthcare Providers: BoilerBrush.com.cy This test is not yet approved or  cleared  by the https://pope.com/ FDA and has been authorized for detection and/or diagnosis of SARS-CoV-2 by FDA under an Emergency Use Authorization (EUA).  This EUA will remain in effect (meaning this test can be used) for the duration of the COVID-19 declaration under Section 564(b)(1) of the Act, 21 U.S.C. section 360bbb-3(b)(1), unless the authorization is terminated or revoked sooner. Performed at [facility], [address] [place], [place]     No results for input(s): LIPASE, AMYLASE in the last 168 hours. No results for input(s): AMMONIA in the last 168 hours.  Cardiac Enzymes: No results for input(s): CKTOTAL, CKMB, CKMBINDEX, TROPONINI in the last 168 hours. BNP (last 3 results) No results for input(s): BNP in the last 8760 hours.  ProBNP (last 3 results) No results  for input(s): PROBNP in the last 8760 hours.  Studies:  CT ABDOMEN PELVIS W CONTRAST  Result Date: 04/26/2020 CLINICAL DATA:  Abdominal pain, bright red blood in stool for 5 days EXAM: CT ABDOMEN AND PELVIS WITH CONTRAST TECHNIQUE: Multidetector CT imaging of the abdomen and pelvis was performed using the standard protocol following bolus administration of intravenous contrast. CONTRAST:  1106mL OMNIPAQUE IOHEXOL 300 MG/ML  SOLN COMPARISON:  None. FINDINGS: Lower chest: No acute pleural or parenchymal lung disease. Hepatobiliary: No focal liver abnormality is seen. No gallstones, gallbladder wall thickening, or biliary dilatation. Pancreas: Unremarkable. No pancreatic ductal dilatation or surrounding inflammatory changes. Spleen: Normal in size without focal abnormality. Adrenals/Urinary Tract: Adrenal glands are unremarkable. Kidneys are normal, without renal calculi, focal lesion, or hydronephrosis. Bladder is unremarkable. Stomach/Bowel: There is diffuse diverticulosis of the descending and sigmoid colon. There is eccentric wall thickening involving the anti mesenteric wall of the mid sigmoid  colon. 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 This can best be seen on sagittal image 68. No bowel obstruction or ileus. Normal appendix is seen in the right lower quadrant. Moderate stool throughout the colon. Vascular/Lymphatic: Mild aortic atherosclerosis is noted. There are 2 borderline enlarged lymph nodes within the left lower quadrant at the level of the left iliac bifurcation, reference image 49. Largest lymph node measures 9 mm. Reproductive: Uterus and bilateral adnexa are unremarkable. Other: There is no free fluid or free gas. No abdominal wall hernia. Musculoskeletal: No acute or destructive bony lesions. Reconstructed images demonstrate no additional findings. IMPRESSION: 1. Abnormal wall thickening within a segment of the mid sigmoid colon, with evidence of underlying colocolic fistula. The appearance could reflect sequela from prior bouts of diverticulitis, though colonoscopy is recommended to exclude underlying neoplasm. 2. Borderline enlarged lymph nodes within the left external iliac distribution, nonspecific. Electronically Signed   By: Randa Ngo M.D.   On: 04/26/2020 00:28       [place]   [facility] If 7PM-7AM, please contact night-coverage at [email or web address], Office  [phone]   04/27/2020, 1:21 PM  LOS: 1 day

## 2020-04-27 NOTE — Consult Note (Signed)
Referring Provider: Dr. Darrick Meigs Primary Care Physician:  Rogers Blocker, MD Primary Gastroenterologist:  Althia Forts  Reason for Consultation:  Hematochezia; Abdominal pain; Colocolic fistula  HPI: Natalie Howard is a 69 y.o. female who had the acute onset of large amount of blood in solid stool that started last Sunday. Blood was bright red mixed in brown stool and was large volume initially. Had LLQ aching pain as well. Denies N/V/F/C/melena. Report a normal colonoscopy 3 years ago in [place] (records not available). CT showed mid-sigmoid wall thickening concerning for a colocolic fistula. Hgb 9.5.  Past Medical History:  Diagnosis Date  . Depression   . Diabetes ([place])   . Gait abnormality 10/29/2017    History reviewed. No pertinent surgical history.  Prior to Admission medications   Medication Sig Start Date End Date Taking? Authorizing Provider  meloxicam (MOBIC) 7.5 MG tablet Take 7.5 mg by mouth daily as needed for pain.   Yes [provider]  naproxen sodium (ALEVE) 220 MG tablet Take 220 mg by mouth daily as needed (for pain).   Yes [provider]  PARoxetine (PAXIL) 10 MG tablet Take 10 mg daily by mouth.   Yes [provider]    Scheduled Meds: . insulin aspart  0-9 Units Subcutaneous Q6H  . PARoxetine  10 mg Oral Daily   Continuous Infusions: . sodium chloride 125 mL/hr at 04/27/20 1002  . sodium chloride    . sodium chloride    . ciprofloxacin Stopped (04/27/20 0533)  . metronidazole 500 mg (04/27/20 0102)   PRN Meds:.acetaminophen **OR** acetaminophen, morphine injection  Allergies as of 04/25/2020 - Review Complete 04/25/2020  Allergen Reaction Noted  . Epitol [carbamazepine]  10/29/2017  . Gluten meal  10/29/2017  . Mold extract [trichophyton]  10/29/2017  . Penicillins  10/29/2017    Family History  Problem Relation Age of Onset  . Diabetes Mother   . Cancer - Colon Father   . Diabetes Sister   . Heart disease Brother   .  Renal Disease Brother     Social History   Socioeconomic History  . Marital status: Widowed    Spouse name: Not on file  . Number of children: 1  . Years of education: 54  . Highest education level: Not on file  Occupational History  . Occupation: Retired  Tobacco Use  . Smoking status: Former Research scientist (life sciences)  . Smokeless tobacco: Never Used  Substance and Sexual Activity  . Alcohol use: No    Comment: Hx alcohol abuse  . Drug use: No  . Sexual activity: Not on file  Other Topics Concern  . Not on file  Social History Narrative   Lives with daughter   Caffeine use: 1 cup Sunday daily   Social Determinants of Health   Financial Resource Strain:   . Difficulty of Paying Living Expenses:   Food Insecurity:   . Worried About Charity fundraiser in the Last Year:   . Arboriculturist in the Last Year:   Transportation Needs:   . Film/video editor (Medical):   Marland Kitchen Lack of Transportation (Non-Medical):   Physical Activity:   . Days of Exercise per Week:   . Minutes of Exercise per Session:   Stress:   . Feeling of Stress :   Social Connections:   . Frequency of Communication with Friends and Family:   . Frequency of Social Gatherings with Friends and Family:   . Attends Religious Services:   . Active  Member of Clubs or Organizations:   . Attends Club or Organization Meetings:   . Marital Status:   Intimate Partner Violence:   . Fear of Current or Ex-Partner:   . Emotionally Abused:   . Physically Abused:   . Sexually Abused:     Review of Systems: All negative except as stated above in HPI.  Physical Exam: Vital signs: Vitals:   04/27/20 0540 04/27/20 1313  BP: 111/69 124/77  Pulse: 88 82  Resp: 18 16  Temp: 99 F (37.2 C) 99.2 F (37.3 C)  SpO2: 94% 97%   Last BM Date: 04/26/20 General:   Lethargic, Well-developed, well-nourished, pleasant and cooperative in NAD Head: normocephalic, atraumatic Eyes: anicteric sclera ENT: oropharynx clear Neck: supple,  nontender Lungs:  Clear throughout to auscultation.   No wheezes, crackles, or rhonchi. No acute distress. Heart:  Regular rate and rhythm; no murmurs, clicks, rubs,  or gallops. Abdomen: LLQ and suprapubic tenderness with guarding, soft, nondistended, +BS Rectal:  Deferred Ext: no edema  GI:  Lab Results: Recent Labs    04/25/20 2231 04/25/20 2231 04/26/20 1600 04/26/20 2314 04/27/20 0820  WBC 5.5  --   --   --  6.2  HGB 11.5*   < > 10.0* 9.7* 10.2*  HCT 35.1*   < > 32.0* 30.8* 32.8*  PLT 243  --   --   --  221   < > = values in this interval not displayed.   BMET Recent Labs    04/25/20 2231 04/26/20 1600 04/27/20 0820  NA 142 142 141  K 3.9 4.3 3.9  CL 108 110 109  CO2 25 26 23  GLUCOSE 127* 82 101*  BUN 12 10 9  CREATININE 0.79 0.73 0.85  CALCIUM 9.1 8.6* 8.3*   LFT Recent Labs    04/27/20 0820  PROT 6.5  ALBUMIN 3.5  AST 14*  ALT 14  ALKPHOS 82  BILITOT 0.4   PT/INR No results for input(s): LABPROT, INR in the last 72 hours.   Studies/Results: CT ABDOMEN PELVIS W CONTRAST  Result Date: 04/26/2020 CLINICAL DATA:  Abdominal pain, bright red blood in stool for 5 days EXAM: CT ABDOMEN AND PELVIS WITH CONTRAST TECHNIQUE: Multidetector CT imaging of the abdomen and pelvis was performed using the standard protocol following bolus administration of intravenous contrast. CONTRAST:  100mL OMNIPAQUE IOHEXOL 300 MG/ML  SOLN COMPARISON:  None. FINDINGS: Lower chest: No acute pleural or parenchymal lung disease. Hepatobiliary: No focal liver abnormality is seen. No gallstones, gallbladder wall thickening, or biliary dilatation. Pancreas: Unremarkable. No pancreatic ductal dilatation or surrounding inflammatory changes. Spleen: Normal in size without focal abnormality. Adrenals/Urinary Tract: Adrenal glands are unremarkable. Kidneys are normal, without renal calculi, focal lesion, or hydronephrosis. Bladder is unremarkable. Stomach/Bowel: There is diffuse diverticulosis  of the descending and sigmoid colon. There is eccentric wall thickening involving the anti mesenteric wall of the mid sigmoid colon. 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 This can best be seen on sagittal image 68. No bowel obstruction or ileus. Normal appendix is seen in the right lower quadrant. Moderate stool throughout the colon. Vascular/Lymphatic: Mild aortic atherosclerosis is noted. There are 2 borderline enlarged lymph nodes within the left lower quadrant at the level of the left iliac bifurcation, reference image 49. Largest lymph node measures 9 mm. Reproductive: Uterus and bilateral adnexa are unremarkable. Other: There is no free fluid or free gas. No abdominal wall hernia.   Musculoskeletal: No acute or destructive bony lesions. Reconstructed images demonstrate no additional findings. IMPRESSION: 1. Abnormal wall thickening within a segment of the mid sigmoid colon, with evidence of underlying colocolic fistula. The appearance could reflect sequela from prior bouts of diverticulitis, though colonoscopy is recommended to exclude underlying neoplasm. 2. Borderline enlarged lymph nodes within the left external iliac distribution, nonspecific. Electronically Signed   By: Sharlet Salina M.D.   On: 04/26/2020 00:28    Impression/Plan: Hematochezia with LLQ abdominal pain and CT concerning for a colocolic fistula in need of a sigmoidoscopy to evaluate for malignancy. Tap water enemas today and flex sig tomorrow. Keep NPO. Supportive care.    LOS: 1 day   Shirley Friar  04/27/2020, 2:24 PM  Questions please call [phone]

## 2020-04-27 NOTE — H&P (View-Only) (Signed)
Referring Provider: Dr. Darrick Meigs Primary Care Physician:  Rogers Blocker, MD Primary Gastroenterologist:  Althia Forts  Reason for Consultation:  Hematochezia; Abdominal pain; Colocolic fistula  HPI: Natalie Howard is a 69 y.o. female who had the acute onset of large amount of blood in solid stool that started last Sunday. Blood was bright red mixed in brown stool and was large volume initially. Had LLQ aching pain as well. Denies N/V/F/C/melena. Report a normal colonoscopy 3 years ago in [place] (records not available). CT showed mid-sigmoid wall thickening concerning for a colocolic fistula. Hgb 9.5.  Past Medical History:  Diagnosis Date  . Depression   . Diabetes ([place])   . Gait abnormality 10/29/2017    History reviewed. No pertinent surgical history.  Prior to Admission medications   Medication Sig Start Date End Date Taking? Authorizing Provider  meloxicam (MOBIC) 7.5 MG tablet Take 7.5 mg by mouth daily as needed for pain.   Yes [provider]  naproxen sodium (ALEVE) 220 MG tablet Take 220 mg by mouth daily as needed (for pain).   Yes [provider]  PARoxetine (PAXIL) 10 MG tablet Take 10 mg daily by mouth.   Yes [provider]    Scheduled Meds: . insulin aspart  0-9 Units Subcutaneous Q6H  . PARoxetine  10 mg Oral Daily   Continuous Infusions: . sodium chloride 125 mL/hr at 04/27/20 1002  . sodium chloride    . sodium chloride    . ciprofloxacin Stopped (04/27/20 0533)  . metronidazole 500 mg (04/27/20 0102)   PRN Meds:.acetaminophen **OR** acetaminophen, morphine injection  Allergies as of 04/25/2020 - Review Complete 04/25/2020  Allergen Reaction Noted  . Epitol [carbamazepine]  10/29/2017  . Gluten meal  10/29/2017  . Mold extract [trichophyton]  10/29/2017  . Penicillins  10/29/2017    Family History  Problem Relation Age of Onset  . Diabetes Mother   . Cancer - Colon Father   . Diabetes Sister   . Heart disease Brother   .  Renal Disease Brother     Social History   Socioeconomic History  . Marital status: Widowed    Spouse name: Not on file  . Number of children: 1  . Years of education: 54  . Highest education level: Not on file  Occupational History  . Occupation: Retired  Tobacco Use  . Smoking status: Former Research scientist (life sciences)  . Smokeless tobacco: Never Used  Substance and Sexual Activity  . Alcohol use: No    Comment: Hx alcohol abuse  . Drug use: No  . Sexual activity: Not on file  Other Topics Concern  . Not on file  Social History Narrative   Lives with daughter   Caffeine use: 1 cup Sunday daily   Social Determinants of Health   Financial Resource Strain:   . Difficulty of Paying Living Expenses:   Food Insecurity:   . Worried About Charity fundraiser in the Last Year:   . Arboriculturist in the Last Year:   Transportation Needs:   . Film/video editor (Medical):   Marland Kitchen Lack of Transportation (Non-Medical):   Physical Activity:   . Days of Exercise per Week:   . Minutes of Exercise per Session:   Stress:   . Feeling of Stress :   Social Connections:   . Frequency of Communication with Friends and Family:   . Frequency of Social Gatherings with Friends and Family:   . Attends Religious Services:   . Active  Member of Clubs or Organizations:   . Attends Banker Meetings:   Marland Kitchen Marital Status:   Intimate Partner Violence:   . Fear of Current or Ex-Partner:   . Emotionally Abused:   Marland Kitchen Physically Abused:   . Sexually Abused:     Review of Systems: All negative except as stated above in HPI.  Physical Exam: Vital signs: Vitals:   04/27/20 0540 04/27/20 1313  BP: 111/69 124/77  Pulse: 88 82  Resp: 18 16  Temp: 99 F (37.2 C) 99.2 F (37.3 C)  SpO2: 94% 97%   Last BM Date: 04/26/20 General:   Lethargic, Well-developed, well-nourished, pleasant and cooperative in NAD Head: normocephalic, atraumatic Eyes: anicteric sclera ENT: oropharynx clear Neck: supple,  nontender Lungs:  Clear throughout to auscultation.   No wheezes, crackles, or rhonchi. No acute distress. Heart:  Regular rate and rhythm; no murmurs, clicks, rubs,  or gallops. Abdomen: LLQ and suprapubic tenderness with guarding, soft, nondistended, +BS Rectal:  Deferred Ext: no edema  GI:  Lab Results: Recent Labs    04/25/20 2231 04/25/20 2231 04/26/20 1600 04/26/20 2314 04/27/20 0820  WBC 5.5  --   --   --  6.2  HGB 11.5*   < > 10.0* 9.7* 10.2*  HCT 35.1*   < > 32.0* 30.8* 32.8*  PLT 243  --   --   --  221   < > = values in this interval not displayed.   BMET Recent Labs    04/25/20 2231 04/26/20 1600 04/27/20 0820  NA 142 142 141  K 3.9 4.3 3.9  CL 108 110 109  CO2 25 26 23   GLUCOSE 127* 82 101*  BUN 12 10 9   CREATININE 0.79 0.73 0.85  CALCIUM 9.1 8.6* 8.3*   LFT Recent Labs    04/27/20 0820  PROT 6.5  ALBUMIN 3.5  AST 14*  ALT 14  ALKPHOS 82  BILITOT 0.4   PT/INR No results for input(s): LABPROT, INR in the last 72 hours.   Studies/Results: CT ABDOMEN PELVIS W CONTRAST  Result Date: 04/26/2020 CLINICAL DATA:  Abdominal pain, bright red blood in stool for 5 days EXAM: CT ABDOMEN AND PELVIS WITH CONTRAST TECHNIQUE: Multidetector CT imaging of the abdomen and pelvis was performed using the standard protocol following bolus administration of intravenous contrast. CONTRAST:  04/29/20 OMNIPAQUE IOHEXOL 300 MG/ML  SOLN COMPARISON:  None. FINDINGS: Lower chest: No acute pleural or parenchymal lung disease. Hepatobiliary: No focal liver abnormality is seen. No gallstones, gallbladder wall thickening, or biliary dilatation. Pancreas: Unremarkable. No pancreatic ductal dilatation or surrounding inflammatory changes. Spleen: Normal in size without focal abnormality. Adrenals/Urinary Tract: Adrenal glands are unremarkable. Kidneys are normal, without renal calculi, focal lesion, or hydronephrosis. Bladder is unremarkable. Stomach/Bowel: There is diffuse diverticulosis  of the descending and sigmoid colon. There is eccentric wall thickening involving the anti mesenteric wall of the mid sigmoid colon. 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 This can best be seen on sagittal image 68. No bowel obstruction or ileus. Normal appendix is seen in the right lower quadrant. Moderate stool throughout the colon. Vascular/Lymphatic: Mild aortic atherosclerosis is noted. There are 2 borderline enlarged lymph nodes within the left lower quadrant at the level of the left iliac bifurcation, reference image 49. Largest lymph node measures 9 mm. Reproductive: Uterus and bilateral adnexa are unremarkable. Other: There is no free fluid or free gas. No abdominal wall hernia.  Musculoskeletal: No acute or destructive bony lesions. Reconstructed images demonstrate no additional findings. IMPRESSION: 1. Abnormal wall thickening within a segment of the mid sigmoid colon, with evidence of underlying colocolic fistula. The appearance could reflect sequela from prior bouts of diverticulitis, though colonoscopy is recommended to exclude underlying neoplasm. 2. Borderline enlarged lymph nodes within the left external iliac distribution, nonspecific. Electronically Signed   By: Sharlet Salina M.D.   On: 04/26/2020 00:28    Impression/Plan: Hematochezia with LLQ abdominal pain and CT concerning for a colocolic fistula in need of a sigmoidoscopy to evaluate for malignancy. Tap water enemas today and flex sig tomorrow. Keep NPO. Supportive care.    LOS: 1 day   Shirley Friar  04/27/2020, 2:24 PM  Questions please call [phone]

## 2020-04-28 ENCOUNTER — Encounter (HOSPITAL_COMMUNITY)
Admission: EM | Disposition: A | Discharge status: Home / Self Care | Payer: Self-pay | Source: Home / Self Care | Attending: Family Medicine

## 2020-04-28 ENCOUNTER — Inpatient Hospital Stay (HOSPITAL_COMMUNITY): Payer: Medicare Other | Admitting: Certified Registered Nurse Anesthetist

## 2020-04-28 ENCOUNTER — Encounter (HOSPITAL_COMMUNITY): Payer: Self-pay | Admitting: Internal Medicine

## 2020-04-28 HISTORY — PX: FLEXIBLE SIGMOIDOSCOPY: SHX5431

## 2020-04-28 HISTORY — PX: SUBMUCOSAL TATTOO INJECTION: SHX6856

## 2020-04-28 HISTORY — PX: BIOPSY: SHX5522

## 2020-04-28 LAB — GLUCOSE, CAPILLARY
Glucose-Capillary: 75 mg/dL (ref 70–99)
Glucose-Capillary: 76 mg/dL (ref 70–99)
Glucose-Capillary: 82 mg/dL (ref 70–99)
Glucose-Capillary: 84 mg/dL (ref 70–99)
Glucose-Capillary: 99 mg/dL (ref 70–99)

## 2020-04-28 LAB — CBC
HCT: 30.6 % — ABNORMAL LOW (ref 36.0–46.0)
Hemoglobin: 9.7 g/dL — ABNORMAL LOW (ref 12.0–15.0)
MCH: 27.2 pg (ref 26.0–34.0)
MCHC: 31.7 g/dL (ref 30.0–36.0)
MCV: 86 fL (ref 80.0–100.0)
Platelets: 199 10*3/uL (ref 150–400)
RBC: 3.56 MIL/uL — ABNORMAL LOW (ref 3.87–5.11)
RDW: 14.6 % (ref 11.5–15.5)
WBC: 4.2 10*3/uL (ref 4.0–10.5)
nRBC: 0 % (ref 0.0–0.2)

## 2020-04-28 LAB — BASIC METABOLIC PANEL
Anion gap: 7 (ref 5–15)
BUN: 8 mg/dL (ref 8–23)
CO2: 23 mmol/L (ref 22–32)
Calcium: 8.9 mg/dL (ref 8.9–10.3)
Chloride: 110 mmol/L (ref 98–111)
Creatinine, Ser: 0.81 mg/dL (ref 0.44–1.00)
GFR calc Af Amer: >60 mL/min (ref >60)
GFR calc non Af Amer: >60 mL/min (ref >60)
Glucose, Bld: 93 mg/dL (ref 70–99)
Potassium: 3.9 mmol/L (ref 3.5–5.1)
Sodium: 140 mmol/L (ref 135–145)

## 2020-04-28 SURGERY — SIGMOIDOSCOPY, FLEXIBLE
Anesthesia: Monitor Anesthesia Care

## 2020-04-28 MED ORDER — ACETAMINOPHEN 160 MG/5ML PO SOLN
1000.0000 mg | Freq: Once | ORAL | Status: DC | PRN
  Filled 2020-04-28: qty 40.6

## 2020-04-28 MED ORDER — PROPOFOL 500 MG/50ML IV EMUL
INTRAVENOUS | Status: DC | PRN
  Administered 2020-04-28: 100 ug/kg/min via INTRAVENOUS

## 2020-04-28 MED ORDER — LACTATED RINGERS IV SOLN: INTRAVENOUS | Status: DC | PRN

## 2020-04-28 MED ORDER — LIDOCAINE 2% (20 MG/ML) 5 ML SYRINGE
INTRAMUSCULAR | Status: DC | PRN
  Administered 2020-04-28: 80 mg via INTRAVENOUS

## 2020-04-28 MED ORDER — ALUM & MAG HYDROXIDE-SIMETH 200-200-20 MG/5ML PO SUSP: 30.0000 mL | ORAL | Status: AC | PRN

## 2020-04-28 MED ORDER — SPOT INK MARKER SYRINGE KIT
PACK | SUBMUCOSAL | Status: AC
  Filled 2020-04-28: qty 5

## 2020-04-28 MED ORDER — SPOT INK MARKER SYRINGE KIT
PACK | SUBMUCOSAL | Status: DC | PRN
  Administered 2020-04-28: 5 mL via SUBMUCOSAL

## 2020-04-28 MED ORDER — PROPOFOL 10 MG/ML IV BOLUS
INTRAVENOUS | Status: DC | PRN
  Administered 2020-04-28 (×3): 20 mg via INTRAVENOUS

## 2020-04-28 MED ORDER — ACETAMINOPHEN 500 MG PO TABS: 1000.0000 mg | ORAL_TABLET | Freq: Once | ORAL | Status: DC | PRN

## 2020-04-28 SURGICAL SUPPLY — 22 items

## 2020-04-28 NOTE — Progress Notes (Signed)
[facility]:  [phone] General Surgery Progress Note   LOS: 2 days  POD -  Day of Surgery  Assessment and Plan: 1.  Possible colonic fistula/diverticulitis  FLEXIBLE SIGMOIDOSCOPY - 5/16 - [facility] - Inflammatory changes at 40 cm from anus on sigmoidoscopy  Cipro/flagyl  WBC - 4,200 - 04/28/2020  She will probably need the abnormal section of sigmoid colon resected.  I went over the surgical/medical options with the patient.  Dr. Cliffton Asters is our surgeon of the week this coming week and will make decisions with the patient.  She can have clear liquids for now, but do not advance diet until seen by Dr. Cliffton Asters.  2.  DM 3.  Anemia  Hgb - 9.7 - 04/28/2020 3.  DVT prophylaxis - to start Lovenox   Active Problems:   Acute GI bleeding   Colo-enteric fistula   Diverticulitis of colon with colocolinic fistula   Acute diverticulitis  Subjective:  Minimal LLQ discomfort.  No more bloody.  Her daughter, Rethel Sebek, is at the bed side.  She lives with her daughter.  She moved down here from [place] about 3 years ago.  She has no other children.  Objective:   Vitals:   04/28/20 1000 04/28/20 1015  BP: (!) 141/82 (!) 147/84  Pulse: 71 68  Resp: (!) 24 (!) 22  Temp:  98.6 F (37 C)  SpO2: 100% 100%     Intake/Output from previous day:  05/15 0701 - 05/16 0700 In: 2393.2 [I.V.:2108.2; IV Piggyback:284.9] Out: 0   Intake/Output this shift:  Total I/O In: 950 [I.V.:950] Out: -    Physical Exam:   General: WN AA F who is alert and oriented.    HEENT: Normal. Pupils equal. .   Lungs: Clear   Abdomen: Soft.  Mild lower abdominal tenderness.   Lab Results:    Recent Labs    04/27/20 0820 04/27/20 0820 04/27/20 1518 04/28/20 0743  WBC 6.2  --   --  4.2  HGB 10.2*   < > 9.5* 9.7*  HCT 32.8*   < > 29.4* 30.6*  PLT 221  --   --  199   < > = values in this interval not displayed.    BMET   Recent Labs    04/27/20 0820 04/28/20 0743  NA 141 140  K  3.9 3.9  CL 109 110  CO2 23 23  GLUCOSE 101* 93  BUN 9 8  CREATININE 0.85 0.81  CALCIUM 8.3* 8.9    PT/INR  No results for input(s): LABPROT, INR in the last 72 hours.  ABG  No results for input(s): PHART, HCO3 in the last 72 hours.  Invalid input(s): PCO2, PO2   Studies/Results:  No results found.   Anti-infectives:   Anti-infectives (From admission, onward)   Start     Dose/Rate Route Frequency Ordered Stop   04/26/20 1545  ciprofloxacin (CIPRO) IVPB 400 mg     400 mg 200 mL/hr over 60 Minutes Intravenous Every 12 hours 04/26/20 1541     04/26/20 1545  metroNIDAZOLE (FLAGYL) IVPB 500 mg     500 mg 100 mL/hr over 60 Minutes Intravenous Every 8 hours 04/26/20 1541        Ovidio Kin, MD, [facility] Office: [phone] 04/28/2020

## 2020-04-28 NOTE — Anesthesia Preprocedure Evaluation (Addendum)
Anesthesia Evaluation  Patient identified by MRN, date of birth, ID band Patient awake    Reviewed: Allergy & Precautions, NPO status , Patient's Chart, lab work & pertinent test results  History of Anesthesia Complications Negative for: history of anesthetic complications  Airway Mallampati: III  TM Distance: >3 FB Neck ROM: Full    Dental  (+) Dental Advisory Given, Missing   Pulmonary neg pulmonary ROS, neg recent URI, former smoker,    breath sounds clear to auscultation       Cardiovascular (-) hypertension(-) angina(-) Past MI and (-) CHF (-) dysrhythmias  Rhythm:Regular     Neuro/Psych PSYCHIATRIC DISORDERS Depression    GI/Hepatic Neg liver ROS, Abdominal pain; Hematochezia   Endo/Other  diabetes, Type 2  Renal/GU negative Renal ROS     Musculoskeletal   Abdominal   Peds  Hematology  (+) Blood dyscrasia, anemia ,   Anesthesia Other Findings   Reproductive/Obstetrics                            Anesthesia Physical Anesthesia Plan  ASA: II  Anesthesia Plan: MAC   Post-op Pain Management:    Induction: Intravenous  PONV Risk Score and Plan: 2 and Treatment may vary due to age or medical condition and Propofol infusion  Airway Management Planned: Nasal Cannula  Additional Equipment: None  Intra-op Plan:   Post-operative Plan:   Informed Consent: I have reviewed the patients History and Physical, chart, labs and discussed the procedure including the risks, benefits and alternatives for the proposed anesthesia with the patient or authorized representative who has indicated his/her understanding and acceptance.     Dental advisory given  Plan Discussed with: CRNA  Anesthesia Plan Comments:         Anesthesia Quick Evaluation

## 2020-04-28 NOTE — Interval H&P Note (Signed)
History and Physical Interval Note:  04/28/2020 8:46 AM  Natalie Howard  has presented today for surgery, with the diagnosis of Abdominal pain; Hematochezia.  The various methods of treatment have been discussed with the patient and family. After consideration of risks, benefits and other options for treatment, the patient has consented to  Procedure(s): COLONOSCOPY WITH PROPOFOL (N/A) as a surgical intervention.  The patient's history has been reviewed, patient examined, no change in status, stable for surgery.  I have reviewed the patient's chart and labs.  Questions were answered to the patient's satisfaction.     Shirley Friar

## 2020-04-28 NOTE — Anesthesia Postprocedure Evaluation (Signed)
Anesthesia Post Note  Patient: Natalie Howard  Procedure(s) Performed: FLEXIBLE SIGMOIDOSCOPY (N/A ) BIOPSY SUBMUCOSAL TATTOO INJECTION     Patient location during evaluation: Endoscopy Anesthesia Type: MAC Level of consciousness: awake and alert Pain management: pain level controlled Vital Signs Assessment: post-procedure vital signs reviewed and stable Respiratory status: spontaneous breathing, nonlabored ventilation, respiratory function stable and patient connected to nasal cannula oxygen Cardiovascular status: stable and blood pressure returned to baseline Postop Assessment: no apparent nausea or vomiting Anesthetic complications: no    Last Vitals:  Vitals:   04/28/20 1000 04/28/20 1015  BP: (!) 141/82 (!) 147/84  Pulse: 71 68  Resp: (!) 24 (!) 22  Temp:  37 C  SpO2: 100% 100%    Last Pain:  Vitals:   04/28/20 1320  TempSrc:   PainSc: 7                  Marybella Ethier

## 2020-04-28 NOTE — Transfer of Care (Signed)
Immediate Anesthesia Transfer of Care Note  Patient: Natalie Howard  Procedure(s) Performed: FLEXIBLE SIGMOIDOSCOPY (N/A ) BIOPSY SUBMUCOSAL TATTOO INJECTION  Patient Location: PACU  Anesthesia Type:MAC  Level of Consciousness: awake, alert , oriented and patient cooperative  Airway & Oxygen Therapy: Patient Spontanous Breathing and Patient connected to face mask oxygen  Post-op Assessment: Report given to RN, Post -op Vital signs reviewed and stable and Patient moving all extremities  Post vital signs: Reviewed and stable  Last Vitals:  Vitals Value Taken Time  BP 131/88 04/28/20 0938  Temp    Pulse 86 04/28/20 0940  Resp 15 04/28/20 0940  SpO2 96 % 04/28/20 0940  Vitals shown include unvalidated device data.  Last Pain:  Vitals:   04/28/20 0810  TempSrc: Oral  PainSc: 5          Complications: No apparent anesthesia complications

## 2020-04-28 NOTE — Op Note (Signed)
[facility] Patient Name: Natalie Howard Procedure Date: 04/28/2020 MRN: [id number] Attending MD: Shirley Friar , MD Date of Birth: 01/01/1951 CSN: [id number] Age: 69 Admit Type: Inpatient Procedure:                Flexible Sigmoidoscopy Indications:              Abdominal pain in the left lower quadrant,                            Hematochezia Providers:                Shirley Friar, MD, Norman Clay, RN, Lawson Radar, Technician, Steffanie Dunn CRNA Referring MD:             hospital team Medicines:                Propofol per Anesthesia, Monitored Anesthesia Care Complications:            No immediate complications. Estimated Blood Loss:     Estimated blood loss was minimal. Procedure:                Pre-Anesthesia Assessment:                           - Prior to the procedure, a History and Physical                            was performed, and patient medications and                            allergies were reviewed. The patient's tolerance of                            previous anesthesia was also reviewed. The risks                            and benefits of the procedure and the sedation                            options and risks were discussed with the patient.                            All questions were answered, and informed consent                            was obtained. Prior Anticoagulants: The patient has                            taken no previous anticoagulant or antiplatelet                            agents. ASA Grade Assessment: II - A patient with  mild systemic disease. After reviewing the risks                            and benefits, the patient was deemed in                            satisfactory condition to undergo the procedure.                           After obtaining informed consent, the scope was                            passed under direct vision. The PCF-H190DL                           ([id number]) Olympus pediatric colonscope was                            introduced through the anus and advanced to the the                            sigmoid colon. After obtaining informed consent,                            the scope was passed under direct vision. The                            flexible sigmoidoscopy was accomplished without                            difficulty. The patient tolerated the procedure                            well. The quality of the bowel preparation was                            unsatisfactory. Scope In: 9:12:39 AM Scope Out: 9:30:56 AM Total Procedure Duration: 0 hours 18 minutes 17 seconds  Findings:      The perianal and digital rectal examinations were normal.      A segmental area of moderately congested and inflamed mucosa was found       in the mid sigmoid colon. Area was tattooed with an injection of 5 mL of       Spot (carbon black).      A localized area of moderately nodular mucosa was found in the mid       sigmoid colon. Biopsies were taken with a cold forceps for histology.      Multiple small and large-mouthed diverticula were found in the sigmoid       colon.      Due to sigmoid inflammation and multiple diverticulae and colonoscope       not advanced proximal to 40 cm where the inflammation was greatest.       White milky fluid noted in part of this area of the sigmoid colon.      Internal hemorrhoids were found during retroflexion. The hemorrhoids  were medium-sized and Grade I (internal hemorrhoids that do not       prolapse). Impression:               - Preparation of the colon was unsatisfactory.                           - Congested and inflamed mucosa in the mid sigmoid                            colon. Tattooed.                           - Nodular mucosa in the mid sigmoid colon. Biopsied.                           - Diverticulosis in the sigmoid colon.                           - Internal  hemorrhoids. Moderate Sedation:      Not Applicable - Patient had care per Anesthesia. Recommendation:           - Await pathology results.                           - NPO.                           - When inflammation resolves will need a colon prep                            and a colonoscopy as an outpt. Procedure Code(s):        --- Professional ---                           [phone], Sigmoidoscopy, flexible; with biopsy, single                            or multiple                           [id number], Sigmoidoscopy, flexible; with directed                            submucosal injection(s), any substance Diagnosis Code(s):        --- Professional ---                           K92.1, Melena (includes Hematochezia)                           R10.32, Left lower quadrant pain                           K52.9, Noninfective gastroenteritis and colitis,                            unspecified  K63.89, Other specified diseases of intestine                           K64.0, First degree hemorrhoids                           K57.30, Diverticulosis of large intestine without                            perforation or abscess without bleeding CPT copyright 2019 American Medical Association. All rights reserved. The codes documented in this report are preliminary and upon coder review may  be revised to meet current compliance requirements. Shirley Friar, MD 04/28/2020 9:42:53 AM This report has been signed electronically. Number of Addenda: 0

## 2020-04-28 NOTE — Brief Op Note (Signed)
Moderate to severe inflammation at 40 cm from the anus (proximal to mid sigmoid) with extensive diverticulosis and one focal area concerning for the fistula with milky fluid seen. Due to concern of perforation from the inflammation and extensive diverticulae, the colonoscope was not advanced proximal to this area. Biopsy of a nodular area adjacent to this area was taken. Site was tattooed. See endopro for complete findings. D/W CCS. Will follow.

## 2020-04-28 NOTE — Progress Notes (Signed)
[facility]  PROGRESS NOTE  Natalie Howard JXB:[id number] DOB: 03-07-1951 DOA: 04/25/2020 PCP: Rogers Blocker, MD   Brief HPI:   69 year old female with history of diabetes mellitus type 2, presents with complaints of abdominal pain, bright red burgundy colored stool for 5 days. In the ED CT abdomen pelvis showed abdominal wall thickening within a segment of mid sigmoid colon with evidence of underlying colocolic fistula. Dr. Marlou Starks was consulted and recommended transfer to [facility] under medicine service.    Subjective   Patient seen and examined, s/p flexible sigmoidoscopy which showed inflammatory changes at 40 cm from anus.  Also showed extensive diverticulosis and one focal area concerning for fistula with milky fluid seen.   Assessment/Plan:     1. Sigmoid Diverticulitis with colocolonic fistula-CT scan showed abnormal wall thickening within the segment of mid sigmoid colon with underlying colocolonic fistula representing previous bouts of diverticulitis. General surgery was consulted, they recommended sigmoidoscopy to rule out fistula.  Sigmoidoscopy confirms inflammation at 40 cm from anus with focal area of fistula.  General surgery has planned to operate and remove that inflamed sigmoid colon area.   2. Acute GI bleeding likely from diverticulitis-CT abdomen showed colocolonic fistula with abdominal thickening. Patient started on Cipro and Flagyl. As needed IV morphine. Will hold chemical DVT prophylaxis due to GI bleed at this time. 3. Diabetes mellitus type 2-patient is sliding scale insulin. Metformin on hold. 4. Anxiety-continue Paxil.     SpO2: 100 % O2 Flow Rate (L/min): 2 L/min   COVID-19 Labs  No results for input(s): DDIMER, FERRITIN, LDH, CRP in the last 72 hours.  Lab Results  Component Value Date   [place] NEGATIVE 04/26/2020     CBG: Recent Labs  Lab 04/27/20 1724 04/28/20 0028 04/28/20 0612 04/28/20 1009 04/28/20 1127  GLUCAP 75 84  82 76 75    CBC: Recent Labs  Lab 04/25/20 2231 04/25/20 2231 04/26/20 1600 04/26/20 2314 04/27/20 0820 04/27/20 1518 04/28/20 0743  WBC 5.5  --   --   --  6.2  --  4.2  HGB 11.5*   < > 10.0* 9.7* 10.2* 9.5* 9.7*  HCT 35.1*   < > 32.0* 30.8* 32.8* 29.4* 30.6*  MCV 84.0  --   --   --  86.3  --  86.0  PLT 243  --   --   --  221  --  199   < > = values in this interval not displayed.    Basic Metabolic Panel: Recent Labs  Lab 04/25/20 2231 04/26/20 1600 04/27/20 0820 04/28/20 0743  NA 142 142 141 140  K 3.9 4.3 3.9 3.9  CL 108 110 109 110  CO2 25 26 23 23   GLUCOSE 127* 82 101* 93  BUN 12 10 9 8   CREATININE 0.79 0.73 0.85 0.81  CALCIUM 9.1 8.6* 8.3* 8.9     Liver Function Tests: Recent Labs  Lab 04/25/20 2231 04/27/20 0820  AST 14* 14*  ALT 15 14  ALKPHOS 91 82  BILITOT 0.5 0.4  PROT 7.3 6.5  ALBUMIN 3.9 3.5        DVT prophylaxis: SCDs  Code Status: Full code  Family Communication: Discussed with patient's daughter at bedside  Disposition Plan:   Status is: Inpatient  Dispo: The patient is from: Home              Anticipated d/c is to: Home  Anticipated d/c date is: 04/29/2020              Patient currently admitted with sigmoid: Diverticulitis with colocolonic fistula. Barrier to discharge-pending GI evaluation. No plans for surgery in the hospital at this time.        Scheduled medications:  . insulin aspart  0-9 Units Subcutaneous Q6H  . PARoxetine  10 mg Oral Daily    Consultants:  General surgery  Procedures:    Antibiotics:   Anti-infectives (From admission, onward)   Start     Dose/Rate Route Frequency Ordered Stop   04/26/20 1545  ciprofloxacin (CIPRO) IVPB 400 mg     400 mg 200 mL/hr over 60 Minutes Intravenous Every 12 hours 04/26/20 1541     04/26/20 1545  metroNIDAZOLE (FLAGYL) IVPB 500 mg     500 mg 100 mL/hr over 60 Minutes Intravenous Every 8 hours 04/26/20 1541         Objective   Vitals:    04/28/20 0810 04/28/20 0945 04/28/20 1000 04/28/20 1015  BP: 123/71 137/88 (!) 141/82 (!) 147/84  Pulse: 81 88 71 68  Resp: 14 16 (!) 24 (!) 22  Temp: 98.5 F (36.9 C) 98 F (36.7 C)  98.6 F (37 C)  TempSrc: Oral     SpO2: 97% 100% 100% 100%  Weight:      Height:        Intake/Output Summary (Last 24 hours) at 04/28/2020 1513 Last data filed at 04/28/2020 1015 Gross per 24 hour  Intake 2381.27 ml  Output 0 ml  Net 2381.27 ml    05/14 1901 - 05/16 0700 In: 3619.2 [I.V.:2734.2] Out: 0   Filed Weights   04/25/20 2117  Weight: 73.5 kg    Physical Examination:  General-appears in no acute distress Heart-S1-S2, regular, no murmur auscultated Lungs-clear to auscultation bilaterally, no wheezing or crackles auscultated Abdomen-soft, nontender, no organomegaly Extremities-no edema in the lower extremities Neuro-alert, oriented x3, no focal deficit noted  Data Reviewed:   Recent Results (from the past 240 hour(s))  SARS Coronavirus 2 by RT PCR (hospital order, performed in [facility] hospital lab) Nasopharyngeal Nasopharyngeal Swab     Status: None   Collection Time: 04/26/20 12:34 AM   Specimen: Nasopharyngeal Swab  Result Value Ref Range Status   SARS Coronavirus 2 NEGATIVE NEGATIVE Final    Comment: (NOTE) SARS-CoV-2 target nucleic acids are NOT DETECTED. The SARS-CoV-2 RNA is generally detectable in upper and lower respiratory specimens during the acute phase of infection. The lowest concentration of SARS-CoV-2 viral copies this assay can detect is 250 copies / mL. A negative result does not preclude SARS-CoV-2 infection and should not be used as the sole basis for treatment or other patient management decisions.  A negative result may occur with improper specimen collection / handling, submission of specimen other than nasopharyngeal swab, presence of viral mutation(s) within the areas targeted by this assay, and inadequate number of viral copies (<250 copies  / mL). A negative result must be combined with clinical observations, patient history, and epidemiological information. Fact Sheet for Patients:   BoilerBrush.com.cy Fact Sheet for Healthcare Providers: https://pope.com/ This test is not yet approved or cleared  by the [place] FDA and has been authorized for detection and/or diagnosis of SARS-CoV-2 by FDA under an Emergency Use Authorization (EUA).  This EUA will remain in effect (meaning this test can be used) for the duration of the COVID-19 declaration under Section 564(b)(1) of the Act, 21 U.S.C. section  360bbb-3(b)(1), unless the authorization is terminated or revoked sooner. Performed at [facility], [address] [place], [place] [address]        Meredeth Ide   [facility] If 7PM-7AM, please contact night-coverage at [email or web address], Office  [phone]   04/28/2020, 3:13 PM  LOS: 2 days

## 2020-04-29 LAB — GLUCOSE, CAPILLARY
Glucose-Capillary: 112 mg/dL — ABNORMAL HIGH (ref 70–99)
Glucose-Capillary: 117 mg/dL — ABNORMAL HIGH (ref 70–99)
Glucose-Capillary: 174 mg/dL — ABNORMAL HIGH (ref 70–99)
Glucose-Capillary: 77 mg/dL (ref 70–99)
Glucose-Capillary: 97 mg/dL (ref 70–99)
Glucose-Capillary: 98 mg/dL (ref 70–99)

## 2020-04-29 LAB — BASIC METABOLIC PANEL
Anion gap: 6 (ref 5–15)
BUN: 6 mg/dL — ABNORMAL LOW (ref 8–23)
CO2: 25 mmol/L (ref 22–32)
Calcium: 8.5 mg/dL — ABNORMAL LOW (ref 8.9–10.3)
Chloride: 108 mmol/L (ref 98–111)
Creatinine, Ser: 0.83 mg/dL (ref 0.44–1.00)
GFR calc Af Amer: >60 mL/min (ref >60)
GFR calc non Af Amer: >60 mL/min (ref >60)
Glucose, Bld: 108 mg/dL — ABNORMAL HIGH (ref 70–99)
Potassium: 3.5 mmol/L (ref 3.5–5.1)
Sodium: 139 mmol/L (ref 135–145)

## 2020-04-29 MED ORDER — DEXTROSE-NACL 5-0.9 % IV SOLN: INTRAVENOUS | Status: DC

## 2020-04-29 MED ORDER — POLYETHYLENE GLYCOL 3350 17 G PO PACK
17.0000 g | PACK | Freq: Two times a day (BID) | ORAL | Status: DC
  Administered 2020-04-29 – 2020-05-01 (×4): 17 g via ORAL
  Filled 2020-04-29 (×4): qty 1

## 2020-04-29 NOTE — Progress Notes (Signed)
[facility] Progress Note  Natalie Howard 69 y.o. [birth year]  CC:  Abdominal pain   Subjective: Patient reports feeling better today.  Notes mild LLQ and RLQ pain.  Still has some nausea and had one episode of emesis today.  Has been tolerating clear liquids.  Last bowel movement was Saturday 5/15.  Denies any additional rectal bleeding.  ROS : Review of Systems  Cardiovascular: Negative for chest pain and palpitations.  Gastrointestinal: Positive for abdominal pain (RLQ, LLQ), constipation and nausea. Negative for blood in stool, diarrhea, heartburn, melena and vomiting.     Objective: Vital signs in last 24 hours: Vitals:   04/28/20 2241 04/29/20 0514  BP: 123/67 (!) 104/58  Pulse: 86 84  Resp: 16 16  Temp: 99 F (37.2 C) 98.9 F (37.2 C)  SpO2: 98% 93%    Physical Exam:  General:  Lethargic, cooperative, no distress, appears stated age  Head:  Normocephalic, without obvious abnormality, atraumatic  Eyes:  Anicteric sclera, EOMs intact  Lungs:   Clear to auscultation bilaterally, respirations unlabored  Heart:  Regular rate and rhythm, S1/S2 normal  Abdomen:   Soft, nondistended, mildly tender to palpation of LLQ and RLQ, normoactive bowel sounds, no guarding or peritoneal signs   Extremities: Extremities normal, atraumatic, no  edema  Pulses: 2+ and symmetric    Lab Results: Recent Labs    04/28/20 0743 04/29/20 0315  NA 140 139  K 3.9 3.5  CL 110 108  CO2 23 25  GLUCOSE 93 108*  BUN 8 6*  CREATININE 0.81 0.83  CALCIUM 8.9 8.5*   Recent Labs    04/27/20 0820  AST 14*  ALT 14  ALKPHOS 82  BILITOT 0.4  PROT 6.5  ALBUMIN 3.5   Recent Labs    04/27/20 0820 04/27/20 0820 04/27/20 1518 04/28/20 0743  WBC 6.2  --   --  4.2  HGB 10.2*   < > 9.5* 9.7*  HCT 32.8*   < > 29.4* 30.6*  MCV 86.3  --   --  86.0  PLT 221  --   --  199   < > = values in this interval not displayed.   No results for input(s): LABPROT, INR in the last 72  hours.   Assessment/Plan: Abdominal pain, colonic fistula -Flex sig 04/28/20 showed moderate to severe inflammation at 40 cm from the anus (proximal to mid sigmoid) with extensive diverticulosis and one focal area concerning for the fistula with milky fluid seen. Biopsy of a nodular area adjacent to this area was taken and site was tattooed.  -Surgical consult placed; no plans for surgery at this time -Patient on empiric antibiotics for possible diverticulitis  Plan: -Await pathology -Continue supportive care -IV Dextrose decreased to 48mL/h -Continue Flagyl for now -Patient will need full colonoscopy as an outpatient due to family history of colon cancer  [facility] will follow.  Edrick Kins PA-C 04/29/2020, 1:41 PM  Contact #  [phone]

## 2020-04-29 NOTE — Progress Notes (Signed)
[facility]  PROGRESS NOTE  Natalie Howard BJY:[id number] DOB: 01/19/1951 DOA: 04/25/2020 PCP: Rogers Blocker, MD   Brief HPI:   69 year old female with history of diabetes mellitus type 2, presents with complaints of abdominal pain, bright red burgundy colored stool for 5 days. In the ED CT abdomen pelvis showed abdominal wall thickening within a segment of mid sigmoid colon with evidence of underlying colocolic fistula. Dr. Marlou Starks was consulted and recommended transfer to [facility] under medicine service.    Subjective   Patient seen and examined, s/p sigmoidoscopy yesterday.  Sigmoidoscopy showed inflammation at 40 cm from anus with focal area of fistula.  General surgery is following    Assessment/Plan:     1. Sigmoid Diverticulitis with colocolonic fistula-CT scan showed abnormal wall thickening within the segment of mid sigmoid colon with underlying colocolonic fistula representing previous bouts of diverticulitis. General surgery was consulted, they recommended sigmoidoscopy to rule out fistula.  Sigmoidoscopy confirms inflammation at 40 cm from anus with focal area of fistula.  General surgery plans to continue with current management, cooling of and getting well.  For scope prior to any operation to evaluate rest of colon.General surgery has started her on  full liquid diet. 2. Hypoglycemia-we will change IV fluids to D5 normal saline at 100 ml per hour 3. Acute GI bleeding likely from diverticulitis-CT abdomen showed colocolonic fistula with abdominal thickening. Patient started on Cipro and Flagyl. As needed IV morphine. Will hold chemical DVT prophylaxis due to GI bleed at this time. 4. Diabetes mellitus type 2-patient is sliding scale insulin. Metformin on hold. 5. Anxiety-continue Paxil.     SpO2: 93 % O2 Flow Rate (L/min): 2 L/min   COVID-19 Labs  No results for input(s): DDIMER, FERRITIN, LDH, CRP in the last 72 hours.  Lab Results  Component Value Date    [place] NEGATIVE 04/26/2020     CBG: Recent Labs  Lab 04/28/20 1127 04/28/20 1801 04/28/20 2354 04/29/20 0624 04/29/20 0800  GLUCAP 75 99 77 98 97    CBC: Recent Labs  Lab 04/25/20 2231 04/25/20 2231 04/26/20 1600 04/26/20 2314 04/27/20 0820 04/27/20 1518 04/28/20 0743  WBC 5.5  --   --   --  6.2  --  4.2  HGB 11.5*   < > 10.0* 9.7* 10.2* 9.5* 9.7*  HCT 35.1*   < > 32.0* 30.8* 32.8* 29.4* 30.6*  MCV 84.0  --   --   --  86.3  --  86.0  PLT 243  --   --   --  221  --  199   < > = values in this interval not displayed.    Basic Metabolic Panel: Recent Labs  Lab 04/25/20 2231 04/26/20 1600 04/27/20 0820 04/28/20 0743 04/29/20 0315  NA 142 142 141 140 139  K 3.9 4.3 3.9 3.9 3.5  CL 108 110 109 110 108  CO2 25 26 23 23 25   GLUCOSE 127* 82 101* 93 108*  BUN 12 10 9 8  6*  CREATININE 0.79 0.73 0.85 0.81 0.83  CALCIUM 9.1 8.6* 8.3* 8.9 8.5*     Liver Function Tests: Recent Labs  Lab 04/25/20 2231 04/27/20 0820  AST 14* 14*  ALT 15 14  ALKPHOS 91 82  BILITOT 0.5 0.4  PROT 7.3 6.5  ALBUMIN 3.9 3.5        DVT prophylaxis: SCDs  Code Status: Full code  Family Communication: Discussed with patient's daughter at bedside  Disposition Plan:   Status  is: Inpatient  Dispo: The patient is from: Home              Anticipated d/c is to: Home              Anticipated d/c date is: 04/29/2020              Patient currently admitted with sigmoid: Diverticulitis with colocolonic fistula. Barrier to discharge-pending clinical improvement and may be repeat colonoscopy before any surgery.        Scheduled medications:  . insulin aspart  0-9 Units Subcutaneous Q6H  . PARoxetine  10 mg Oral Daily  . polyethylene glycol  17 g Oral BID    Consultants:  General surgery  Procedures:    Antibiotics:   Anti-infectives (From admission, onward)   Start     Dose/Rate Route Frequency Ordered Stop   04/26/20 1545  ciprofloxacin (CIPRO) IVPB 400 mg      400 mg 200 mL/hr over 60 Minutes Intravenous Every 12 hours 04/26/20 1541     04/26/20 1545  metroNIDAZOLE (FLAGYL) IVPB 500 mg     500 mg 100 mL/hr over 60 Minutes Intravenous Every 8 hours 04/26/20 1541         Objective   Vitals:   04/28/20 1015 04/28/20 1950 04/28/20 2241 04/29/20 0514  BP: (!) 147/84  123/67 (!) 104/58  Pulse: 68  86 84  Resp: (!) 22 18 16 16   Temp: 98.6 F (37 C)  99 F (37.2 C) 98.9 F (37.2 C)  TempSrc:   Oral Oral  SpO2: 100%  98% 93%  Weight:      Height:        Intake/Output Summary (Last 24 hours) at 04/29/2020 0919 Last data filed at 04/29/2020 05/01/2020 Gross per 24 hour  Intake 4499.07 ml  Output --  Net 4499.07 ml    05/15 1901 - 05/17 0700 In: 5344.8 [P.O.:180; I.V.:4149.8] Out: 0   Filed Weights   04/25/20 2117  Weight: 73.5 kg    Physical Examination:    General: Appears in no acute distress  Cardiovascular: S1-S2, regular, no murmur auscultated  Respiratory: Clear to auscultation bilaterally, no wheezing auscultated  Abdomen: Abdomen is soft, nondistended, no organomegaly, nontender to palpation  Extremities: Trace edema in the lower extremities  Neurologic: Alert, oriented x3, no focal deficit, moving all extremities   Data Reviewed:   Recent Results (from the past 240 hour(s))  SARS Coronavirus 2 by RT PCR (hospital order, performed in [facility] hospital lab) Nasopharyngeal Nasopharyngeal Swab     Status: None   Collection Time: 04/26/20 12:34 AM   Specimen: Nasopharyngeal Swab  Result Value Ref Range Status   SARS Coronavirus 2 NEGATIVE NEGATIVE Final    Comment: (NOTE) SARS-CoV-2 target nucleic acids are NOT DETECTED. The SARS-CoV-2 RNA is generally detectable in upper and lower respiratory specimens during the acute phase of infection. The lowest concentration of SARS-CoV-2 viral copies this assay can detect is 250 copies / mL. A negative result does not preclude SARS-CoV-2 infection and should not be  used as the sole basis for treatment or other patient management decisions.  A negative result may occur with improper specimen collection / handling, submission of specimen other than nasopharyngeal swab, presence of viral mutation(s) within the areas targeted by this assay, and inadequate number of viral copies (<250 copies / mL). A negative result must be combined with clinical observations, patient history, and epidemiological information. Fact Sheet for Patients:   04/28/20 Fact Sheet  for Healthcare Providers: https://pope.com/ This test is not yet approved or cleared  by the [place] and has been authorized for detection and/or diagnosis of SARS-CoV-2 by FDA under an Emergency Use Authorization (EUA).  This EUA will remain in effect (meaning this test can be used) for the duration of the COVID-19 declaration under Section 564(b)(1) of the Act, 21 U.S.C. section 360bbb-3(b)(1), unless the authorization is terminated or revoked sooner. Performed at [facility], [address] [place], [place] [address]        Meredeth Ide   [facility] If 7PM-7AM, please contact night-coverage at [email or web address], Office  [phone]   04/29/2020, 9:19 AM  LOS: 3 days

## 2020-04-29 NOTE — Progress Notes (Signed)
[facility] Office:  [phone] General Surgery Progress Note   LOS: 3 days  POD -  1 Day Post-Op  Assessment and Plan: 1.  Possible colonic fistula/diverticulitis  FLEXIBLE SIGMOIDOSCOPY - 5/16 - [facility] - Inflammatory changes at 40 cm from anus on sigmoidoscopy, biopsied  Cipro/flagyl  WBC - normal Advance to full liquids; start miralax. At this point, no evidence of obstruction or large mass to suggest this being malignancy, imaging concerning for colo-colonic fistula in setting of diverticulitis. Differential would also include IBD and mass but workup this far favoring diverticulitis. Given improvement with current regimen, will gently advance diet and keep stools soft. Ideally cooling off and getting well prepped scope prior to any operations to evaluate rest of colon given GIB symptoms on presentation. 2.  DM 3.  Anemia  Hgb - 9.7 - 04/28/2020 3.  DVT prophylaxis - to start Lovenox   Active Problems:   Acute GI bleeding   Colo-enteric fistula   Diverticulitis of colon with colocolinic fistula   Acute diverticulitis  Subjective:  Improving LLQ discomfort relative to admission - 'much better.' Denies nausea/vomiting. Had flex sig yesterday - no clear obstruction per se or large fungating mass. Hungry - asking for a chicken sandwich from [employer].  Her daughter, Leonore Frankson, is at the bed side.  She lives with her daughter.    Objective:   Vitals:   04/28/20 2241 04/29/20 0514  BP: 123/67 (!) 104/58  Pulse: 86 84  Resp: 16 16  Temp: 99 F (37.2 C) 98.9 F (37.2 C)  SpO2: 98% 93%     Intake/Output from previous day:  05/16 0701 - 05/17 0700 In: 4379.1 [P.O.:180; I.V.:3184; IV Piggyback:1015.1] Out: -   Intake/Output this shift:  No intake/output data recorded.   Physical Exam:   General: WN AA F who is alert and oriented.    HEENT: Normal. Pupils equal. .   Lungs: Clear   Abdomen: Soft.  Mild lower abdominal tenderness.   Lab Results:     Recent Labs    04/27/20 0820 04/27/20 0820 04/27/20 1518 04/28/20 0743  WBC 6.2  --   --  4.2  HGB 10.2*   < > 9.5* 9.7*  HCT 32.8*   < > 29.4* 30.6*  PLT 221  --   --  199   < > = values in this interval not displayed.    BMET   Recent Labs    04/28/20 0743 04/29/20 0315  NA 140 139  K 3.9 3.5  CL 110 108  CO2 23 25  GLUCOSE 93 108*  BUN 8 6*  CREATININE 0.81 0.83  CALCIUM 8.9 8.5*    PT/INR  No results for input(s): LABPROT, INR in the last 72 hours.  ABG  No results for input(s): PHART, HCO3 in the last 72 hours.  Invalid input(s): PCO2, PO2   Studies/Results:  No results found.   Anti-infectives:   Anti-infectives (From admission, onward)   Start     Dose/Rate Route Frequency Ordered Stop   04/26/20 1545  ciprofloxacin (CIPRO) IVPB 400 mg     400 mg 200 mL/hr over 60 Minutes Intravenous Every 12 hours 04/26/20 1541     04/26/20 1545  metroNIDAZOLE (FLAGYL) IVPB 500 mg     500 mg 100 mL/hr over 60 Minutes Intravenous Every 8 hours 04/26/20 1541        Ovidio Kin, MD, [facility] Office: [phone] 04/29/2020

## 2020-04-29 NOTE — Plan of Care (Signed)
  Problem: Education: Goal: Knowledge of General Education information will improve Description: Including pain rating scale, medication(s)/side effects and non-pharmacologic comfort measures Outcome: Progressing   Problem: Elimination: Goal: Will not experience complications related to urinary retention Outcome: Progressing   

## 2020-04-29 NOTE — Care Management Important Message (Signed)
Important Message  Patient Details IM Letter given to Agustin Cree RN Case Manager to present to the Patient Name: Natalie Howard MRN: [id number] Date of Birth: 11-06-1951   Medicare Important Message Given:  Yes     Freddie, Nghiem 04/29/2020, 12:29 PM

## 2020-04-30 ENCOUNTER — Encounter: Payer: Self-pay | Admitting: *Deleted

## 2020-04-30 LAB — GLUCOSE, CAPILLARY
Glucose-Capillary: 116 mg/dL — ABNORMAL HIGH (ref 70–99)
Glucose-Capillary: 114 mg/dL — ABNORMAL HIGH (ref 70–99)
Glucose-Capillary: 111 mg/dL — ABNORMAL HIGH (ref 70–99)
Glucose-Capillary: 118 mg/dL — ABNORMAL HIGH (ref 70–99)

## 2020-04-30 LAB — BASIC METABOLIC PANEL
Anion gap: 7 (ref 5–15)
BUN: 7 mg/dL — ABNORMAL LOW (ref 8–23)
CO2: 24 mmol/L (ref 22–32)
Calcium: 8.7 mg/dL — ABNORMAL LOW (ref 8.9–10.3)
Chloride: 111 mmol/L (ref 98–111)
Creatinine, Ser: 0.79 mg/dL (ref 0.44–1.00)
GFR calc Af Amer: >60 mL/min (ref >60)
GFR calc non Af Amer: >60 mL/min (ref >60)
Glucose, Bld: 125 mg/dL — ABNORMAL HIGH (ref 70–99)
Potassium: 4 mmol/L (ref 3.5–5.1)
Sodium: 142 mmol/L (ref 135–145)

## 2020-04-30 LAB — CBC
HCT: 28.2 % — ABNORMAL LOW (ref 36.0–46.0)
Hemoglobin: 8.9 g/dL — ABNORMAL LOW (ref 12.0–15.0)
MCH: 26.6 pg (ref 26.0–34.0)
MCHC: 31.6 g/dL (ref 30.0–36.0)
MCV: 84.4 fL (ref 80.0–100.0)
Platelets: 186 10*3/uL (ref 150–400)
RBC: 3.34 MIL/uL — ABNORMAL LOW (ref 3.87–5.11)
RDW: 14.4 % (ref 11.5–15.5)
WBC: 4.5 10*3/uL (ref 4.0–10.5)
nRBC: 0 % (ref 0.0–0.2)

## 2020-04-30 LAB — SURGICAL PATHOLOGY

## 2020-04-30 NOTE — Progress Notes (Addendum)
[facility]  PROGRESS NOTE  Natalie Howard ZDG:[id number] DOB: [birth year] DOA: 04/25/2020 PCP: Gwenyth Bender, MD   Brief HPI:   69 year old female with history of diabetes mellitus type 2, presents with complaints of abdominal pain, bright red burgundy colored stool for 5 days. In the ED CT abdomen pelvis showed abdominal wall thickening within a segment of mid sigmoid colon with evidence of underlying colocolic fistula. Dr. Carolynne Edouard was consulted and recommended transfer to [facility] under medicine service.    Subjective   Patient seen and examined, tolerating full liquid diet well.   Assessment/Plan:     1. Sigmoid Diverticulitis with colocolonic fistula-CT scan showed abnormal wall thickening within the segment of mid sigmoid colon with underlying colocolonic fistula representing previous bouts of diverticulitis. General surgery was consulted, they recommended sigmoidoscopy to rule out fistula.  Sigmoidoscopy confirms inflammation at 40 cm from anus with focal area of fistula.  General surgery plans to continue with current management, cooling of and getting well.  Colonoscopy prior to any operation to evaluate rest of colon.General surgery has started her on  full liquid diet.  Patient has tolerated diet well.  Today diet will be advanced to soft diet.  If patient tolerates diet well and can be switched to p.o. antibiotics.  Likely discharge home in next 24 to 48 hours. 2. Hypoglycemia-resolved from being n.p.o., D/C D5 normal saline.  Patient has been started on full liquid diet. 3. Acute GI bleeding likely from diverticulitis-CT abdomen showed colocolonic fistula with abdominal thickening. Patient started on Cipro and Flagyl. As needed IV morphine. Will hold chemical DVT prophylaxis due to GI bleed at this time. 4. Diabetes mellitus type 2-patient is sliding scale insulin. Metformin on hold. 5. Anxiety-continue Paxil.     SpO2: 100 % O2 Flow Rate (L/min): 2 L/min    COVID-19 Labs  No results for input(s): DDIMER, FERRITIN, LDH, CRP in the last 72 hours.  Lab Results  Component Value Date   SARSCOV2NAA NEGATIVE 04/26/2020     CBG: Recent Labs  Lab 04/29/20 0800 04/29/20 1133 04/29/20 1647 04/29/20 2333 04/30/20 0555  GLUCAP 97 174* 117* 112* 116*    CBC: Recent Labs  Lab 04/25/20 2231 04/26/20 1600 04/26/20 2314 04/27/20 0820 04/27/20 1518 04/28/20 0743 04/30/20 0440  WBC 5.5  --   --  6.2  --  4.2 4.5  HGB 11.5*   < > 9.7* 10.2* 9.5* 9.7* 8.9*  HCT 35.1*   < > 30.8* 32.8* 29.4* 30.6* 28.2*  MCV 84.0  --   --  86.3  --  86.0 84.4  PLT 243  --   --  221  --  199 186   < > = values in this interval not displayed.    Basic Metabolic Panel: Recent Labs  Lab 04/26/20 1600 04/27/20 0820 04/28/20 0743 04/29/20 0315 04/30/20 0440  NA 142 141 140 139 142  K 4.3 3.9 3.9 3.5 4.0  CL 110 109 110 108 111  CO2 26 23 23 25 24   GLUCOSE 82 101* 93 108* 125*  BUN 10 9 8  6* 7*  CREATININE 0.73 0.85 0.81 0.83 0.79  CALCIUM 8.6* 8.3* 8.9 8.5* 8.7*     Liver Function Tests: Recent Labs  Lab 04/25/20 2231 04/27/20 0820  AST 14* 14*  ALT 15 14  ALKPHOS 91 82  BILITOT 0.5 0.4  PROT 7.3 6.5  ALBUMIN 3.9 3.5        DVT prophylaxis: SCDs  Code Status: Full  code  Family Communication: Discussed with patient's daughter at bedside  Disposition Plan:   Status is: Inpatient  Dispo: The patient is from: Home              Anticipated d/c is to: Home              Anticipated d/c date is: 05/01/2020              Patient currently admitted with sigmoid: Diverticulitis with colocolonic fistula. Barrier to discharge-pending clinical improvement and may be repeat colonoscopy before any surgery.        Scheduled medications:  . insulin aspart  0-9 Units Subcutaneous Q6H  . PARoxetine  10 mg Oral Daily  . polyethylene glycol  17 g Oral BID    Consultants:  General surgery  Procedures:    Antibiotics:    Anti-infectives (From admission, onward)   Start     Dose/Rate Route Frequency Ordered Stop   04/26/20 1545  ciprofloxacin (CIPRO) IVPB 400 mg     400 mg 200 mL/hr over 60 Minutes Intravenous Every 12 hours 04/26/20 1541     04/26/20 1545  metroNIDAZOLE (FLAGYL) IVPB 500 mg     500 mg 100 mL/hr over 60 Minutes Intravenous Every 8 hours 04/26/20 1541         Objective   Vitals:   04/29/20 0514 04/29/20 1409 04/29/20 2032 04/30/20 0603  BP: (!) 104/58 (!) 82/47 129/79 114/69  Pulse: 84 77 66 75  Resp: 16 15 18 16   Temp: 98.9 F (37.2 C) 98.2 F (36.8 C) (!) 97.5 F (36.4 C) 98.6 F (37 C)  TempSrc: Oral Oral Oral Oral  SpO2: 93% 100% 100% 100%  Weight:      Height:        Intake/Output Summary (Last 24 hours) at 04/30/2020 1054 Last data filed at 04/30/2020 1000 Gross per 24 hour  Intake 3490.63 ml  Output 1850 ml  Net 1640.63 ml    05/16 1901 - 05/18 0700 In: 6117.8 [P.O.:1500; I.V.:3102.7] Out: 650 [Urine:650]  Filed Weights   04/25/20 2117  Weight: 73.5 kg    Physical Examination:   General-appears in no acute distress  Heart-S1-S2, regular, no murmur auscultated  Lungs-clear to auscultation bilaterally, no wheezing or crackles auscultated  Abdomen-soft, nontender, no organomegaly  Extremities-no edema in the lower extremities  Neuro-alert, oriented x3, no focal deficit noted   Data Reviewed:   Recent Results (from the past 240 hour(s))  SARS Coronavirus 2 by RT PCR (hospital order, performed in [facility] hospital lab) Nasopharyngeal Nasopharyngeal Swab     Status: None   Collection Time: 04/26/20 12:34 AM   Specimen: Nasopharyngeal Swab  Result Value Ref Range Status   SARS Coronavirus 2 NEGATIVE NEGATIVE Final    Comment: (NOTE) SARS-CoV-2 target nucleic acids are NOT DETECTED. The SARS-CoV-2 RNA is generally detectable in upper and lower respiratory specimens during the acute phase of infection. The lowest concentration of SARS-CoV-2  viral copies this assay can detect is 250 copies / mL. A negative result does not preclude SARS-CoV-2 infection and should not be used as the sole basis for treatment or other patient management decisions.  A negative result may occur with improper specimen collection / handling, submission of specimen other than nasopharyngeal swab, presence of viral mutation(s) within the areas targeted by this assay, and inadequate number of viral copies (<250 copies / mL). A negative result must be combined with clinical observations, patient history, and epidemiological information. Fact  Sheet for Patients:   BoilerBrush.com.cy Fact Sheet for Healthcare Providers: https://pope.com/ This test is not yet approved or cleared  by the [place] FDA and has been authorized for detection and/or diagnosis of SARS-CoV-2 by FDA under an Emergency Use Authorization (EUA).  This EUA will remain in effect (meaning this test can be used) for the duration of the COVID-19 declaration under Section 564(b)(1) of the Act, 21 U.S.C. section 360bbb-3(b)(1), unless the authorization is terminated or revoked sooner. Performed at [facility], [address] [place], [place] [address]        Meredeth Ide   [facility] If 7PM-7AM, please contact night-coverage at [email or web address], Office  [phone]   04/30/2020, 10:54 AM  LOS: 4 days

## 2020-04-30 NOTE — Progress Notes (Signed)
2 Days Post-Op  Subjective: Feeling well today.  Tolerating full liquids.  Pain is improving.  Biopsies from flex sig not back yet.  ROS: See above, otherwise other systems negative  Objective: Vital signs in last 24 hours: Temp:  [97.5 F (36.4 C)-98.6 F (37 C)] 98.6 F (37 C) (05/18 0603) Pulse Rate:  [66-77] 75 (05/18 0603) Resp:  [15-18] 16 (05/18 0603) BP: (82-129)/(47-79) 114/69 (05/18 0603) SpO2:  [100 %] 100 % (05/18 0603) Last BM Date: 04/28/20  Intake/Output from previous day: 05/17 0701 - 05/18 0700 In: 3622.7 [P.O.:1320; I.V.:1602.7; IV Piggyback:700] Out: 650 [Urine:650] Intake/Output this shift: Total I/O In: 707.9 [P.O.:180; I.V.:127.9; IV Piggyback:400] Out: 1200 [Urine:1200]  PE: Heart: regular Lungs: CTAB Abd: soft, minimal LLQ tenderness, +BS, ND  Lab Results:  Recent Labs    04/28/20 0743 04/30/20 0440  WBC 4.2 4.5  HGB 9.7* 8.9*  HCT 30.6* 28.2*  PLT 199 186   BMET Recent Labs    04/29/20 0315 04/30/20 0440  NA 139 142  K 3.5 4.0  CL 108 111  CO2 25 24  GLUCOSE 108* 125*  BUN 6* 7*  CREATININE 0.83 0.79  CALCIUM 8.5* 8.7*   PT/INR No results for input(s): LABPROT, INR in the last 72 hours. CMP     Component Value Date/Time   NA 142 04/30/2020 0440   K 4.0 04/30/2020 0440   CL 111 04/30/2020 0440   CO2 24 04/30/2020 0440   GLUCOSE 125 (H) 04/30/2020 0440   BUN 7 (L) 04/30/2020 0440   CREATININE 0.79 04/30/2020 0440   CALCIUM 8.7 (L) 04/30/2020 0440   PROT 6.5 04/27/2020 0820   PROT 6.9 10/29/2017 1108   ALBUMIN 3.5 04/27/2020 0820   AST 14 (L) 04/27/2020 0820   ALT 14 04/27/2020 0820   ALKPHOS 82 04/27/2020 0820   BILITOT 0.4 04/27/2020 0820   GFRNONAA >60 04/30/2020 0440   GFRAA >60 04/30/2020 0440   Lipase  No results found for: LIPASE     Studies/Results: No results found.  Anti-infectives: Anti-infectives (From admission, onward)   Start     Dose/Rate Route Frequency Ordered Stop   04/26/20 1545   ciprofloxacin (CIPRO) IVPB 400 mg     400 mg 200 mL/hr over 60 Minutes Intravenous Every 12 hours 04/26/20 1541     04/26/20 1545  metroNIDAZOLE (FLAGYL) IVPB 500 mg     500 mg 100 mL/hr over 60 Minutes Intravenous Every 8 hours 04/26/20 1541         Assessment/Plan 1.  Possible colonic fistula/diverticulitis             FLEXIBLE SIGMOIDOSCOPY - 5/16 - [facility] - Inflammatory changes at 40 cm from anus on sigmoidoscopy, biopsied and pending still             Cipro/flagyl, may transition to oral abx             WBC - normal Advance to soft diet, cont miralax. At this point, no evidence of obstruction or large mass to suggest this being malignancy, imaging concerning for colo-colonic fistula in setting of diverticulitis. Differential would also include IBD and mass but workup this far favoring diverticulitis. Given improvement with current regimen, will gently advance diet and keep stools soft. Ideally cooling off and getting well prepped scope prior to any operations to evaluate rest of colon given GIB symptoms on presentation. -patient stable if she tolerates a soft diet and transition to oral abx therapy to DC home  from our standpoint.  Would treat for 14 days total of abx therapy.  She can follow up in our office as an outpatient.  2.  DM 3.  Anemia             Hgb - 9.7 - 04/28/2020 3.  DVT prophylaxis - to start Lovenox              Active Problems:   Acute GI bleeding   Colo-enteric fistula   Diverticulitis of colon with colocolinic fistula   Acute diverticulitis   LOS: 4 days    Letha Cape , [facility] 04/30/2020, 10:50 AM Please see Amion for pager number during day hours 7:00am-4:30pm or 7:00am -11:30am on weekends

## 2020-04-30 NOTE — Progress Notes (Signed)
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 in the office in a few weeks and continue MiraLAX as above and hopefully she can go home soon and please call us if we can be of any further assistance with this hospital stay and agree will need outpatient colonoscopy when properly prepped in roughly 4 to 6 weeks  [facility]  office [phone] After 5PM or if no answer call [phone]

## 2020-04-30 NOTE — Plan of Care (Signed)
  Problem: Bowel/Gastric: Goal: Will show no signs and symptoms of gastrointestinal bleeding Outcome: Progressing   Problem: Elimination: Goal: Will not experience complications related to urinary retention Outcome: Progressing

## 2020-05-01 LAB — CBC WITH DIFFERENTIAL/PLATELET
Abs Immature Granulocytes: 0.01 10*3/uL (ref 0.00–0.07)
Basophils Absolute: 0 10*3/uL (ref 0.0–0.1)
Basophils Relative: 0 %
Eosinophils Absolute: 0.1 10*3/uL (ref 0.0–0.5)
Eosinophils Relative: 2 %
HCT: 29.9 % — ABNORMAL LOW (ref 36.0–46.0)
Hemoglobin: 9.8 g/dL — ABNORMAL LOW (ref 12.0–15.0)
Immature Granulocytes: 0 %
Lymphocytes Relative: 22 %
Lymphs Abs: 1.2 10*3/uL (ref 0.7–4.0)
MCH: 27.6 pg (ref 26.0–34.0)
MCHC: 32.8 g/dL (ref 30.0–36.0)
MCV: 84.2 fL (ref 80.0–100.0)
Monocytes Absolute: 0.5 10*3/uL (ref 0.1–1.0)
Monocytes Relative: 9 %
Neutro Abs: 3.6 10*3/uL (ref 1.7–7.7)
Neutrophils Relative %: 67 %
Platelets: 189 10*3/uL (ref 150–400)
RBC: 3.55 MIL/uL — ABNORMAL LOW (ref 3.87–5.11)
RDW: 14.5 % (ref 11.5–15.5)
WBC: 5.3 10*3/uL (ref 4.0–10.5)
nRBC: 0 % (ref 0.0–0.2)

## 2020-05-01 LAB — COMPREHENSIVE METABOLIC PANEL
ALT: 13 U/L (ref 0–44)
AST: 14 U/L — ABNORMAL LOW (ref 15–41)
Albumin: 3.3 g/dL — ABNORMAL LOW (ref 3.5–5.0)
Alkaline Phosphatase: 67 U/L (ref 38–126)
Anion gap: 8 (ref 5–15)
BUN: 8 mg/dL (ref 8–23)
CO2: 24 mmol/L (ref 22–32)
Calcium: 8.7 mg/dL — ABNORMAL LOW (ref 8.9–10.3)
Chloride: 108 mmol/L (ref 98–111)
Creatinine, Ser: 0.82 mg/dL (ref 0.44–1.00)
GFR calc Af Amer: >60 mL/min (ref >60)
GFR calc non Af Amer: >60 mL/min (ref >60)
Glucose, Bld: 108 mg/dL — ABNORMAL HIGH (ref 70–99)
Potassium: 3.9 mmol/L (ref 3.5–5.1)
Sodium: 140 mmol/L (ref 135–145)
Total Bilirubin: 0.3 mg/dL (ref 0.3–1.2)
Total Protein: 6.2 g/dL — ABNORMAL LOW (ref 6.5–8.1)

## 2020-05-01 LAB — GLUCOSE, CAPILLARY
Glucose-Capillary: 146 mg/dL — ABNORMAL HIGH (ref 70–99)
Glucose-Capillary: 117 mg/dL — ABNORMAL HIGH (ref 70–99)

## 2020-05-01 MED ORDER — OXYCODONE HCL 5 MG PO TABS: 5.0000 mg | ORAL_TABLET | Freq: Four times a day (QID) | ORAL | 0 refills | Status: AC | PRN

## 2020-05-01 MED ORDER — OXYCODONE HCL 5 MG PO TABS
5.0000 mg | ORAL_TABLET | ORAL | Status: DC | PRN
  Administered 2020-05-01: 5 mg via ORAL
  Filled 2020-05-01: qty 1

## 2020-05-01 MED ORDER — CIPROFLOXACIN HCL 500 MG PO TABS
500.0000 mg | ORAL_TABLET | Freq: Two times a day (BID) | ORAL | 0 refills | Status: AC
Start: 2020-05-01 — End: 2020-05-10

## 2020-05-01 MED ORDER — POLYETHYLENE GLYCOL 3350 17 G PO PACK: 17.0000 g | PACK | Freq: Every day | ORAL | 0 refills | Status: DC

## 2020-05-01 MED ORDER — METRONIDAZOLE 500 MG PO TABS
500.0000 mg | ORAL_TABLET | Freq: Three times a day (TID) | ORAL | 0 refills | Status: AC
Start: 2020-05-01 — End: 2020-05-10

## 2020-05-01 NOTE — Progress Notes (Signed)
Patient was given discharge instructions and all questions were answered.

## 2020-05-01 NOTE — Progress Notes (Signed)
Patient ID: Natalie Howard, female   DOB: Apr 22, 1951, 69 y.o.   MRN: [id number]    3 Days Post-Op  Subjective: Patient tolerating a soft diet today well.  States her pain is improving.  Moving things through  ROS: See above, otherwise other systems negative  Objective: Vital signs in last 24 hours: Temp:  [98 F (36.7 C)-98.4 F (36.9 C)] 98.4 F (36.9 C) (05/19 0515) Pulse Rate:  [77-83] 77 (05/19 0515) Resp:  [18] 18 (05/19 0515) BP: (117-145)/(72-85) 145/85 (05/19 0515) SpO2:  [97 %-98 %] 97 % (05/19 0515) Last BM Date: 04/28/20  Intake/Output from previous day: 05/18 0701 - 05/19 0700 In: 1810.8 [P.O.:780; I.V.:330.8; IV Piggyback:700] Out: 5201 [Urine:5200; Stool:1] Intake/Output this shift: Total I/O In: 570 [P.O.:240; I.V.:30; IV Piggyback:300] Out: 850 [Urine:850]  PE: Abd: soft, still mildly tender in LLQ, + BS, ND  Lab Results:  Recent Labs    04/30/20 0440 05/01/20 0738  WBC 4.5 5.3  HGB 8.9* 9.8*  HCT 28.2* 29.9*  PLT 186 189   BMET Recent Labs    04/30/20 0440 05/01/20 0738  NA 142 140  K 4.0 3.9  CL 111 108  CO2 24 24  GLUCOSE 125* 108*  BUN 7* 8  CREATININE 0.79 0.82  CALCIUM 8.7* 8.7*   PT/INR No results for input(s): LABPROT, INR in the last 72 hours. CMP     Component Value Date/Time   NA 140 05/01/2020 0738   K 3.9 05/01/2020 0738   CL 108 05/01/2020 0738   CO2 24 05/01/2020 0738   GLUCOSE 108 (H) 05/01/2020 0738   BUN 8 05/01/2020 0738   CREATININE 0.82 05/01/2020 0738   CALCIUM 8.7 (L) 05/01/2020 0738   PROT 6.2 (L) 05/01/2020 0738   PROT 6.9 10/29/2017 1108   ALBUMIN 3.3 (L) 05/01/2020 0738   AST 14 (L) 05/01/2020 0738   ALT 13 05/01/2020 0738   ALKPHOS 67 05/01/2020 0738   BILITOT 0.3 05/01/2020 0738   GFRNONAA >60 05/01/2020 0738   GFRAA >60 05/01/2020 0738   Lipase  No results found for: LIPASE     Studies/Results: No results found.  Anti-infectives: Anti-infectives (From admission, onward)   Start     Dose/Rate Route Frequency Ordered Stop   04/26/20 1545  ciprofloxacin (CIPRO) IVPB 400 mg     400 mg 200 mL/hr over 60 Minutes Intravenous Every 12 hours 04/26/20 1541     04/26/20 1545  metroNIDAZOLE (FLAGYL) IVPB 500 mg     500 mg 100 mL/hr over 60 Minutes Intravenous Every 8 hours 04/26/20 1541         Assessment/Plan 1. Possible colonic fistula/diverticulitis FLEXIBLE SIGMOIDOSCOPY - 5/16 - [facility] - Inflammatory changes at 40 cm from anus on sigmoidoscopy, biopsied and pending still Cipro/flagyl, may transition to oral abx WBC - normal Advance to soft diet, cont miralax. At this point, no evidence of obstruction or large mass to suggest this being malignancy, imaging concerning for colo-colonic fistula in setting of diverticulitis. Pathology reveals active colitis with no evidence of IBD or malignancy. Given improvement with current regimen, will gently advance diet and keep stools soft. Ideally cooling off and getting well prepped scope prior to any operations to evaluate rest of colon given GIB symptoms on presentation. -patient tolerating a soft diet.  She can transition to oral abx and is stable for DC home from our standpoint  Would treat for 14 days total of abx therapy.  She can follow up in our office as  an outpatient.  2. DM 3. Anemia Hgb - 9.7 - 04/28/2020 3. 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

## 2020-05-01 NOTE — Discharge Summary (Addendum)
Physician Discharge Summary  [facility] KYH:[id number] DOB: 1951-07-23 DOA: 04/25/2020  PCP: Rogers Blocker, MD  Admit date: 04/25/2020 Discharge date: 05/01/2020  Time spent: 40 minutes  Recommendations for Outpatient Follow-up:  1. Follow outpatient CBC/CMP 2. Follow with GI outpatient for Izak Anding colonoscopy  3. Follow with surgery outpatient    Discharge Diagnoses:  Active Problems:   Acute GI bleeding   Colo-enteric fistula   Diverticulitis of colon with colocolinic fistula   Acute diverticulitis  Discharge Condition: stable  Diet recommendation: soft  Filed Weights   04/25/20 2117  Weight: 73.5 kg   History of present illness:  69 year old female with history of diabetes mellitus type 2, presents with complaints of abdominal pain, bright red burgundy colored stool for 5 days. In the ED CT abdomen pelvis showed abdominal wall thickening within Azriel Dancy segment of mid sigmoid colon with evidence of underlying colocolic fistula. Dr. Marlou Starks was consulted and recommended transfer to [facility] under medicine service.  She was admitted with diverticulitis with concern for colocolic fistula.  She had flex sig which showed inflamed and congested mucosa in the mid sigmoid colon.  Biopsy showed colitis without evidence of inflammatory bowel disease, dysplasia, or malignancy.  Discharged with abx and plan for outpatient colonoscopy and surgery follow up.  Hospital Course:  1. Sigmoid Diverticulitis with colocolonic fistula 1. CT with abnormal wall thickening within Sahir Tolson segment of the mid sigmoid colon, with evidence of underlying colcolonic fistula.  The appearance could reflect sequela from prior bouts of diverticulitis -> colonoscopy recommended to exclude underlying neoplasm.  Borderline enlarged LN within L external iliac distribution.  2. Gen surgery -> recommended sigmoidoscopy  3. Sigmoidoscopy with unsatisfactory prep, congested and inflamed mucosa in the mid sigmoid colon -> when  inflammation resolves needs colonoscopy outpatient 4. Path with focal minimally active colitis, no evidence of inflammatory bowel disease, dysplasia, or malignancy 5. Gen surgery followed, appreciate recs -> recommends colonoscopy before any operation 6. Discharge with abx, 14 total days  2. Hypoglycemia-resolved from being n.p.o., D/C D5 normal saline.  Patient has been started on full liquid diet. 3. Acute GI bleeding likely from diverticulitis-CT abdomen showed colocolonic fistula with abdominal thickening. Patient started on Cipro and Flagyl. As needed IV morphine. Will hold chemical DVT prophylaxis due to GI bleed at this time. 1. Stable Hb on discharge 4. Diabetes mellitus type 2- follow outpatient 5. Anxiety-continue Paxil.  Procedures: Flex sig - Preparation of the colon was unsatisfactory. - Congested and inflamed mucosa in the mid sigmoid colon. Tattooed. - Nodular mucosa in the mid sigmoid colon. Biopsied. - Diverticulosis in the sigmoid colon. - Internal hemorrhoids. Impression: Not Applicable - Patient had care per Anesthesia. Moderate Sedation: - Await pathology results. - NPO. - When inflammation resolves will need Darrold Bezek colon prep and Dwanda Tufano colonoscopy as an outpt.  Consultations:  gen surgery  GI  Discharge Exam: Vitals:   05/01/20 0515 05/01/20 1335  BP: (!) 145/85 130/78  Pulse: 77 97  Resp: 18 16  Temp: 98.4 F (36.9 C) 98.5 F (36.9 C)  SpO2: 97% 96%   Ready to go home W[place] to go [place] this weekend Recommended against that, given return precautions Daughter on phone   General: No acute distress. Cardiovascular: Heart sounds show Magaline Steinberg regular rate, and rhythm.  Lungs: Clear to auscultation bilaterally Abdomen: Soft, nontender, nondistended  Neurological: Alert and oriented 3. Moves all extremities 4. Cranial nerves II through XII grossly intact. Skin: Warm and dry. No rashes  or lesions. Extremities: No clubbing or cyanosis. No edema.  Discharge  Instructions   Discharge Instructions    Call MD for:  difficulty breathing, headache or visual disturbances   Complete by: As directed    Call MD for:  extreme fatigue   Complete by: As directed    Call MD for:  hives   Complete by: As directed    Call MD for:  persistant dizziness or light-headedness   Complete by: As directed    Call MD for:  persistant nausea and vomiting   Complete by: As directed    Call MD for:  redness, tenderness, or signs of infection (pain, swelling, redness, odor or green/yellow discharge around incision site)   Complete by: As directed    Call MD for:  severe uncontrolled pain   Complete by: As directed    Call MD for:  temperature >100.4   Complete by: As directed    Diet - low sodium heart healthy   Complete by: As directed    Discharge instructions   Complete by: As directed    You were seen for diveriticulitis with colocolonic fistula.    You had Jesiah Grismer sigmoidoscopy which showed the fistula and inflammation.    You'll need to follow up with gastroenterology outpatient for Kasch Borquez colonoscopy in [place] few weeks.  You should follow up with surgery as an outpatient as well.    We'll send you home with antibiotics to complete your course and Miryam Mcelhinney few days of pain medications.   Return for new, recurrent, or worsening symptoms.    Please ask your PCP to request records from this hospitalization so they know what was done and what the next steps will be.   Increase activity slowly   Complete by: As directed      Allergies as of 05/01/2020      Reactions   Epitol [carbamazepine] Itching   Gluten Meal    Unknown   Mold Extract [trichophyton]    Runny nose    Penicillins Hives      Medication List    STOP taking these medications   meloxicam 7.5 MG tablet Commonly known as: MOBIC   naproxen sodium 220 MG tablet Commonly known as: ALEVE     TAKE these medications   ciprofloxacin 500 MG tablet Commonly known as: Cipro Take 1 tablet (500 mg total) by  mouth 2 (two) times daily for 9 days.   metroNIDAZOLE 500 MG tablet Commonly known as: Flagyl Take 1 tablet (500 mg total) by mouth 3 (three) times daily for 9 days.   oxyCODONE 5 MG immediate release tablet Commonly known as: Oxy IR/ROXICODONE Take 1 tablet (5 mg total) by mouth every 6 (six) hours as needed for up to 3 days for severe pain.   PARoxetine 10 MG tablet Commonly known as: PAXIL Take 10 mg daily by mouth.   polyethylene glycol 17 g packet Commonly known as: MIRALAX / GLYCOLAX Take 17 g by mouth daily.      Allergies  Allergen Reactions  . Epitol [Carbamazepine] Itching  . Gluten Meal     Unknown  . Mold Extract [Trichophyton]     Runny nose   . Penicillins Hives   Follow-up Information    Andria Meuse, MD Follow up in 4 week(s).   Specialty: General Surgery Why: follow up after you see GI and have full colonoscopy Contact information: [address] [place] [address] [phone]        Bosie Clos,  Oswaldo Done, MD. Schedule an appointment as soon as possible for Tanayia Wahlquist visit in 2 week(s).   Specialty: Gastroenterology Contact information: [address] [place] [address] [phone]        Gwenyth Bender, MD Follow up.   Specialty: Internal Medicine Contact information: [address] [place] [address] [phone]            The results of significant diagnostics from this hospitalization (including imaging, microbiology, ancillary and laboratory) are listed below for reference.    Significant Diagnostic Studies: CT ABDOMEN PELVIS W CONTRAST  Result Date: 04/26/2020 CLINICAL DATA:  Abdominal pain, bright red blood in stool for 5 days EXAM: CT ABDOMEN AND PELVIS WITH CONTRAST TECHNIQUE: Multidetector CT imaging of the abdomen and pelvis was performed using the standard protocol following bolus administration of intravenous contrast. CONTRAST:  OMNIPAQUE IOHEXOL 300 MG/ML  SOLN COMPARISON:  None.  FINDINGS: Lower chest: No acute pleural or parenchymal lung disease. Hepatobiliary: No focal liver abnormality is seen. No gallstones, gallbladder wall thickening, or biliary dilatation. Pancreas: Unremarkable. No pancreatic ductal dilatation or surrounding inflammatory changes. Spleen: Normal in size without focal abnormality. Adrenals/Urinary Tract: Adrenal glands are unremarkable. Kidneys are normal, without renal calculi, focal lesion, or hydronephrosis. Bladder is unremarkable. Stomach/Bowel: There is diffuse diverticulosis of the descending and sigmoid colon. There is eccentric wall thickening involving the anti mesenteric wall of the mid sigmoid colon. 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 This can best be seen on sagittal image 68. No bowel obstruction or ileus. Normal appendix is seen in the right lower quadrant. Moderate stool throughout the colon. Vascular/Lymphatic: Mild aortic atherosclerosis is noted. There are 2 borderline enlarged lymph nodes within the left lower quadrant at the level of the left iliac bifurcation, reference image 49. Largest lymph node measures 9 mm. Reproductive: Uterus and bilateral adnexa are unremarkable. Other: There is no free fluid or free gas. No abdominal wall hernia. Musculoskeletal: No acute or destructive bony lesions. Reconstructed images demonstrate no additional findings. IMPRESSION: 1. Abnormal wall thickening within Breanna Mcdaniel segment of the mid sigmoid colon, with evidence of underlying colocolic fistula. The appearance could reflect sequela from prior bouts of diverticulitis, though colonoscopy is recommended to exclude underlying neoplasm. 2. Borderline enlarged lymph nodes within the left external iliac distribution, nonspecific. Electronically Signed   By: Sharlet Salina M.D.   On: 04/26/2020 00:28    Microbiology: Recent Results (from the past 240 hour(s))  SARS  Coronavirus 2 by RT PCR (hospital order, performed in [facility] hospital lab) Nasopharyngeal Nasopharyngeal Swab     Status: None   Collection Time: 04/26/20 12:34 AM   Specimen: Nasopharyngeal Swab  Result Value Ref Range Status   SARS Coronavirus 2 NEGATIVE NEGATIVE Final    Comment: (NOTE) SARS-CoV-2 target nucleic acids are NOT DETECTED. The SARS-CoV-2 RNA is generally detectable in upper and lower respiratory specimens during the acute phase of infection. The lowest concentration of SARS-CoV-2 viral copies this assay can detect is 250 copies / mL. Solon Alban negative result does not preclude SARS-CoV-2 infection and should not be used as the sole basis for treatment or other patient management decisions.  Clotiel Troop negative result may occur with improper specimen collection / handling, submission of specimen other than nasopharyngeal swab, presence of viral mutation(s) within the areas targeted by this assay, and inadequate number of viral copies (<250 copies / mL). Lessly Stigler negative result must be combined  with clinical observations, patient history, and epidemiological information. Fact Sheet for Patients:   BoilerBrush.com.cy Fact Sheet for Healthcare Providers: https://pope.com/ This test is not yet approved or cleared  by the [place] FDA and has been authorized for detection and/or diagnosis of SARS-CoV-2 by FDA under an Emergency Use Authorization (EUA).  This EUA will remain in effect (meaning this test can be used) for the duration of the COVID-19 declaration under Section 564(b)(1) of the Act, 21 U.S.C. section 360bbb-3(b)(1), unless the authorization is terminated or revoked sooner. Performed at [facility], [address] [place], [place] [address]      Labs: Basic Metabolic Panel: Recent Labs  Lab 04/27/20 0820 04/28/20 0743 04/29/20 0315 04/30/20 0440 05/01/20 0738  NA 141 140 139 142 140  K 3.9 3.9 3.5 4.0 3.9   CL 109 110 108 111 108  CO2 23 23 25 24 24   GLUCOSE 101* 93 108* 125* 108*  BUN 9 8 6* 7* 8  CREATININE 0.85 0.81 0.83 0.79 0.82  CALCIUM 8.3* 8.9 8.5* 8.7* 8.7*   Liver Function Tests: Recent Labs  Lab 04/25/20 2231 04/27/20 0820 05/01/20 0738  AST 14* 14* 14*  ALT 15 14 13   ALKPHOS 91 82 67  BILITOT 0.5 0.4 0.3  PROT 7.3 6.5 6.2*  ALBUMIN 3.9 3.5 3.3*   No results for input(s): LIPASE, AMYLASE in the last 168 hours. No results for input(s): AMMONIA in the last 168 hours. CBC: Recent Labs  Lab 04/25/20 2231 04/26/20 1600 04/27/20 0820 04/27/20 1518 04/28/20 0743 04/30/20 0440 05/01/20 0738  WBC 5.5  --  6.2  --  4.2 4.5 5.3  NEUTROABS  --   --   --   --   --   --  3.6  HGB 11.5*   < > 10.2* 9.5* 9.7* 8.9* 9.8*  HCT 35.1*   < > 32.8* 29.4* 30.6* 28.2* 29.9*  MCV 84.0  --  86.3  --  86.0 84.4 84.2  PLT 243  --  221  --  199 186 189   < > = values in this interval not displayed.   Cardiac Enzymes: No results for input(s): CKTOTAL, CKMB, CKMBINDEX, TROPONINI in the last 168 hours. BNP: BNP (last 3 results) No results for input(s): BNP in the last 8760 hours.  ProBNP (last 3 results) No results for input(s): PROBNP in the last 8760 hours.  CBG: Recent Labs  Lab 04/30/20 1132 04/30/20 1728 04/30/20 2324 05/01/20 0519 05/01/20 1145  GLUCAP 114* 111* 118* 146* 117*       Signed:  05/03/20 MD.  [facility] 05/01/2020, 9:26 PM

## 2020-05-01 NOTE — Progress Notes (Signed)
[facility] Gastroenterology Progress Note  Natalie Howard 69 y.o. [birth year]  CC:  Diverticular fistula  Subjective: Patient reports feeling better this morning.  She has been tolerating a soft diet without any nausea or vomiting.  Last bowel movement was yesterday and was without any melena or hematochezia.  Reports only mild diffuse abdominal pain and states she has not used any pain medication since last evening.  She is interested in discharge today.  ROS : Review of Systems  Constitutional: Negative for chills and fever.  Cardiovascular: Negative for chest pain and palpitations.  Gastrointestinal: Positive for abdominal pain (mild, diffuse). Negative for blood in stool, constipation, diarrhea, heartburn, melena, nausea and vomiting.   Objective: Vital signs in last 24 hours: Vitals:   04/30/20 2058 05/01/20 0515  BP: 133/72 (!) 145/85  Pulse: 83 77  Resp: 18 18  Temp: 98 F (36.7 C) 98.4 F (36.9 C)  SpO2: 98% 97%    Physical Exam:  General:  Alert, oriented, cooperative, no acute distress  Head:  Normocephalic, without obvious abnormality, atraumatic  Eyes:  Anicteric sclera, EOMs intact  Lungs:   Clear to auscultation bilaterally, respirations unlabored  Heart:  Regular rate and rhythm, S1/S2 normal  Abdomen:   Soft, non-distended, mild diffuse tenderness without guarding, no peritoneal signs, normoactive bowel sounds  Extremities: Extremities normal, atraumatic, no  edema  Pulses: 2+ and symmetric    Lab Results: Recent Labs    04/30/20 0440 05/01/20 0738  NA 142 140  K 4.0 3.9  CL 111 108  CO2 24 24  GLUCOSE 125* 108*  BUN 7* 8  CREATININE 0.79 0.82  CALCIUM 8.7* 8.7*   Recent Labs    05/01/20 0738  AST 14*  ALT 13  ALKPHOS 67  BILITOT 0.3  PROT 6.2*  ALBUMIN 3.3*   Recent Labs    04/30/20 0440 05/01/20 0738  WBC 4.5 5.3  NEUTROABS  --  3.6  HGB 8.9* 9.8*  HCT 28.2* 29.9*  MCV 84.4 84.2  PLT 186 189   No results for input(s):  LABPROT, INR in the last 72 hours.  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  Plan: Recommend transitioning to PO antibiotics to complete antibiotic course for a total of 14 days.  OK to discharge from a GI standpoint.  Plan for follow-up with [facility] with plans for colonoscopy once current symptoms have resolved.  [facility] will sign off.  Please contact us if we can be of any further assistance during this hospital stay.  Edrick Kins PA-C 05/01/2020, 10:42 AM  Contact #  [phone]

## 2020-06-25 ENCOUNTER — Ambulatory Visit: Payer: Self-pay | Admitting: General Surgery

## 2020-06-25 MED ORDER — DEXTROSE 5 % IV SOLN
900.0000 mg | INTRAVENOUS | Status: AC
Start: 2020-06-25 — End: 2020-07-31
  Administered 2020-07-31: 900 mg via INTRAVENOUS

## 2020-06-25 MED ORDER — GENTAMICIN SULFATE 40 MG/ML IJ SOLN
5.0000 mg/kg | INTRAMUSCULAR | Status: AC
Start: 2020-06-25 — End: ?

## 2020-06-25 NOTE — H&P (Signed)
The patient is a 69 year old female who presents with diverticulitis. 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 Patient had a normal colonoscopy approximately 3 years ago. She developed some pain in her left lower quadrant approximately one year ago. In May 2021, she developed worsening pain and was seen in the emergency department. A colocolonic fistula was noted on her CT scan. She underwent a flexible sigmoidoscopy with [name]. He was unable to traverse the area of question, but the sigmoid showed signs of inflammation and purulence. She continues to have abdominal pain and is taking oxycodone and MiraLAX for this. C-sections have been her only abdominal surgery. She has no major medical problems.   Past Surgical History Micheal Likens, CMA; 06/25/2020 9:52 AM) Cesarean Section - 1 Foot Surgery Bilateral. Oral Surgery  Diagnostic Studies History (Chanel Lonni Fix, CMA; 06/25/2020 9:52 AM) Colonoscopy never Mammogram within last year Pap Smear 1-5 years ago  Allergies Hedy Camara Lonni Fix, CMA; 06/25/2020 9:54 AM) Penicillins Allergies Reconciled  Medication History (Chanel Lonni Fix, CMA; 06/25/2020 9:54 AM) oxyCODONE HCl (5MG  Tablet, Oral) Active. Paxil (10MG  Tablet, Oral) Active. Medications Reconciled  Pregnancy / Birth History , CMA; 06/25/2020 9:52 AM) Age at menarche 14 years. Age of menopause 39-60 Gravida 1 Length (months) of breastfeeding 3-6 Maternal age 30-40 Para 1  Other Problems (Chanel 45-61, CMA; 06/25/2020 9:52 AM) Depression Diabetes Mellitus     Review of Systems (Chanel Nolan CMA; 06/25/2020 9:52 AM) General Present- Fatigue and Fever. Not Present- Appetite Loss, Chills, Night Sweats, Weight Gain and Weight Loss. Skin Not Present- Change in Wart/Mole, Dryness, Hives, Jaundice, New Lesions, Non-Healing Wounds, Rash and Ulcer. HEENT Not Present- Earache,  Hearing Loss, Hoarseness, Nose Bleed, Oral Ulcers, Ringing in the Ears, Seasonal Allergies, Sinus Pain, Sore Throat, Visual Disturbances, Wears glasses/contact lenses and Yellow Eyes. Breast Not Present- Breast Mass, Breast Pain, Nipple Discharge and Skin Changes. Cardiovascular Present- Shortness of Breath. Not Present- Chest Pain, Difficulty Breathing Lying Down, Leg Cramps, Palpitations, Rapid Heart Rate and Swelling of Extremities. Gastrointestinal Present- Abdominal Pain, Bloody Stool, Change in Bowel Habits, Excessive gas and Nausea. Not Present- Bloating, Chronic diarrhea, Constipation, Difficulty Swallowing, Gets full quickly at meals, Hemorrhoids, Indigestion, Rectal Pain and Vomiting. Female Genitourinary Not Present- Frequency, Nocturia, Painful Urination, Pelvic Pain and Urgency. Musculoskeletal Present- Back Pain. Not Present- Joint Pain, Joint Stiffness, Muscle Pain, Muscle Weakness and Swelling of Extremities. Neurological Present- Headaches. Not Present- Decreased Memory, Fainting, Numbness, Seizures, Tingling, Tremor, Trouble walking and Weakness. Psychiatric Present- Depression. Not Present- Anxiety, Bipolar, Change in Sleep Pattern, Fearful and Frequent crying. Hematology Present- Persistent Infections. Not Present- Blood Thinners, Easy Bruising, Excessive bleeding, Gland problems and HIV.  Vitals (Chanel Nolan CMA; 06/25/2020 9:54 AM) 06/25/2020 9:54 AM Weight: 166.13 lb Height: 65in Body Surface Area: 1.83 m Body Mass Index: 27.64 kg/m  Temp.: 97.52F  Pulse: 88 (Regular)         Physical Exam 06/27/2020 MD; 06/25/2020 10:18 AM)  General Mental Status-Alert. General Appearance-Cooperative. CV: RRR Lungs:CTA Abdomen Palpation/Percussion Palpation and Percussion of the abdomen reveal - Soft. Note: Tender to palpation in left lower quadrant.    Assessment & Plan Romie Levee MD; 06/25/2020 10:17 AM)  Romie Levee STRICTURE  [phone]) Impression: 69 year old female who presents to the office today with partially obstructing colon fistula presumably due to diverticular disease. Patient had a colonoscopy approximately 3 years ago which was normal. Flexible sigmoidoscopy last month also showed no sign of malignancy, although  [name] was unable to get all the way through. CT scan reviewed. I have recommended a elective surgery in mid August with robotic resection and firefly injection prior to surgery. If she continues to have worsening symptoms, she should report to the emergency department for earlier evaluation for surgery. Risks of surgery include: Bleeding, deep abdominal infections and possible wound complications such as hernia and infection, damage to adjacent structures, leak of surgical connections, which can lead to other surgeries and possibly an ostomy, possible need for other procedures, such as abscess drains in radiology, prolonged fatigue/weakness or appetite loss

## 2020-06-27 ENCOUNTER — Emergency Department (HOSPITAL_BASED_OUTPATIENT_CLINIC_OR_DEPARTMENT_OTHER): Payer: Medicare Other

## 2020-06-27 ENCOUNTER — Emergency Department (HOSPITAL_BASED_OUTPATIENT_CLINIC_OR_DEPARTMENT_OTHER)
Admission: EM | Admit: 2020-06-27 | Discharge: 2020-06-27 | Disposition: A | Discharge status: Home / Self Care | Payer: Medicare Other | Attending: Emergency Medicine | Admitting: Emergency Medicine

## 2020-06-27 ENCOUNTER — Other Ambulatory Visit: Payer: Self-pay

## 2020-06-27 ENCOUNTER — Encounter (HOSPITAL_BASED_OUTPATIENT_CLINIC_OR_DEPARTMENT_OTHER): Payer: Self-pay | Admitting: Emergency Medicine

## 2020-06-27 DIAGNOSIS — Z79899 Other long term (current) drug therapy: Secondary | ICD-10-CM | POA: Diagnosis not present

## 2020-06-27 DIAGNOSIS — K632 Fistula of intestine: Secondary | ICD-10-CM | POA: Diagnosis not present

## 2020-06-27 DIAGNOSIS — E119 Type 2 diabetes mellitus without complications: Secondary | ICD-10-CM | POA: Diagnosis not present

## 2020-06-27 DIAGNOSIS — R109 Unspecified abdominal pain: Secondary | ICD-10-CM | POA: Diagnosis present

## 2020-06-27 LAB — URINALYSIS, ROUTINE W REFLEX MICROSCOPIC
Bilirubin Urine: NEGATIVE
Glucose, UA: NEGATIVE mg/dL
Hgb urine dipstick: NEGATIVE
Ketones, ur: NEGATIVE mg/dL
Leukocytes,Ua: NEGATIVE
Nitrite: NEGATIVE
Protein, ur: NEGATIVE mg/dL
Specific Gravity, Urine: 1.015 (ref 1.005–1.030)
pH: 7 (ref 5.0–8.0)

## 2020-06-27 LAB — CBC
HCT: 34.2 % — ABNORMAL LOW (ref 36.0–46.0)
Hemoglobin: 11 g/dL — ABNORMAL LOW (ref 12.0–15.0)
MCH: 26.8 pg (ref 26.0–34.0)
MCHC: 32.2 g/dL (ref 30.0–36.0)
MCV: 83.2 fL (ref 80.0–100.0)
Platelets: 214 10*3/uL (ref 150–400)
RBC: 4.11 MIL/uL (ref 3.87–5.11)
RDW: 14.7 % (ref 11.5–15.5)
WBC: 5.1 10*3/uL (ref 4.0–10.5)
nRBC: 0 % (ref 0.0–0.2)

## 2020-06-27 LAB — COMPREHENSIVE METABOLIC PANEL
ALT: 10 U/L (ref 0–44)
AST: 15 U/L (ref 15–41)
Albumin: 3.9 g/dL (ref 3.5–5.0)
Alkaline Phosphatase: 70 U/L (ref 38–126)
Anion gap: 9 (ref 5–15)
BUN: 9 mg/dL (ref 8–23)
CO2: 22 mmol/L (ref 22–32)
Calcium: 9.1 mg/dL (ref 8.9–10.3)
Chloride: 110 mmol/L (ref 98–111)
Creatinine, Ser: 0.83 mg/dL (ref 0.44–1.00)
GFR calc Af Amer: >60 mL/min (ref >60)
GFR calc non Af Amer: >60 mL/min (ref >60)
Glucose, Bld: 102 mg/dL — ABNORMAL HIGH (ref 70–99)
Potassium: 4.2 mmol/L (ref 3.5–5.1)
Sodium: 141 mmol/L (ref 135–145)
Total Bilirubin: 0.5 mg/dL (ref 0.3–1.2)
Total Protein: 7 g/dL (ref 6.5–8.1)

## 2020-06-27 LAB — LIPASE, BLOOD: Lipase: 21 U/L (ref 11–51)

## 2020-06-27 MED ORDER — ONDANSETRON HCL 4 MG/2ML IJ SOLN
4.0000 mg | Freq: Once | INTRAMUSCULAR | Status: AC
  Administered 2020-06-27: 4 mg via INTRAVENOUS
  Filled 2020-06-27: qty 2

## 2020-06-27 MED ORDER — HYDROMORPHONE HCL 1 MG/ML IJ SOLN
1.0000 mg | Freq: Once | INTRAMUSCULAR | Status: AC
  Administered 2020-06-27: 1 mg via INTRAVENOUS
  Filled 2020-06-27: qty 1

## 2020-06-27 MED ORDER — SODIUM CHLORIDE 0.9 % IV BOLUS
1000.0000 mL | Freq: Once | INTRAVENOUS | Status: AC
  Administered 2020-06-27: 1000 mL via INTRAVENOUS

## 2020-06-27 MED ORDER — IOHEXOL 300 MG/ML  SOLN
100.0000 mL | Freq: Once | INTRAMUSCULAR | Status: AC | PRN
  Administered 2020-06-27: 100 mL via INTRAVENOUS

## 2020-06-27 NOTE — ED Provider Notes (Signed)
[facility] EMERGENCY DEPARTMENT Provider Note   CSN: [id number] Arrival date & time: 06/27/20  0263     History Chief Complaint  Patient presents with  . Abdominal Pain    Natalie Howard is a 69 y.o. 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  States that she began having left lower quadrant abdominal pain since yesterday.  Reports pain is sharp and unrelieved with her home oxycodone.  She reports nausea, nonbloody emesis.  She noticed some blood in her urine today.  Denies any bloody stools but does state that she has been having more bowel movements than usual.  She denies any fevers, dysuria.  Or sick contacts with similar symptoms, cough, shortness of breath.  HPI     Past Medical History:  Diagnosis Date  . Depression   . Diabetes (HCC)   . Gait abnormality 10/29/2017    Patient Active Problem List   Diagnosis Date Noted  . Acute GI bleeding 04/26/2020  . 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 Diverticulitis of colon with colocolinic fistula 04/26/2020  . Acute diverticulitis 04/26/2020  . Tenosynovitis of right wrist 10/02/2019  . Diabetes mellitus type 2 without retinopathy (HCC) 01/13/2018  . Long term current use of oral hypoglycemic drug 01/13/2018  . Nuclear sclerotic cataract of both eyes 01/13/2018  . Posterior vitreous detachment of both eyes 01/13/2018  . 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 Gait abnormality 10/29/2017  . Paresthesia 10/29/2017    Past Surgical History:  Procedure Laterality Date  . BIOPSY  04/28/2020   Procedure: BIOPSY;  Surgeon: Charlott Rakes, MD;  Location: [facility];  Service: Endoscopy;;  . FLEXIBLE SIGMOIDOSCOPY N/A  04/28/2020   Procedure: Arnell Sieving;  Surgeon: Charlott Rakes, MD;  Location: [facility];  Service: Endoscopy;  Laterality: N/A;  . SUBMUCOSAL TATTOO INJECTION  04/28/2020   Procedure: SUBMUCOSAL TATTOO INJECTION;  Surgeon: Charlott Rakes, MD;  Location: [facility];  Service: Endoscopy;;     OB History   No obstetric history on file.     Family History  Problem Relation Age of Onset  . Diabetes Mother   . Cancer - Colon Father   . Diabetes Sister   . Heart disease Brother   . Renal Disease Brother     Social History   Tobacco Use  . Smoking status: Former Games developer  . Smokeless tobacco: Never Used  Vaping Use  . Vaping Use: Never used  Substance Use Topics  . Alcohol use: No    Comment: Hx alcohol abuse  . Drug use: No    Home Medications Prior to Admission medications   Medication Sig Start Date End Date Taking? Authorizing Provider  oxycodone (OXY-IR) 5 MG capsule Take 5 mg by mouth every 4 (four) hours as needed.   Yes [provider]  PARoxetine (PAXIL) 10 MG tablet Take 10 mg daily by mouth.   Yes [provider]  polyethylene glycol (MIRALAX / GLYCOLAX) 17 g packet Take 17 g by mouth daily. 05/01/20  Yes Zigmund Daniel., MD    Allergies    Epitol [carbamazepine], Gluten meal, Mold extract [trichophyton], and Penicillins  Review of Systems   Review of Systems  Constitutional: Negative for appetite change, chills and  fever.  HENT: Negative for ear pain, rhinorrhea, sneezing and sore throat.   Eyes: Negative for photophobia and visual disturbance.  Respiratory: Negative for cough, chest tightness, shortness of breath and wheezing.   Cardiovascular: Negative for chest pain and palpitations.  Gastrointestinal: Positive for abdominal pain, nausea and vomiting. Negative for blood in stool, constipation and diarrhea.  Genitourinary: Positive for hematuria. Negative for dysuria and urgency.  Musculoskeletal: Negative for  myalgias.  Skin: Negative for rash.  Neurological: Negative for dizziness, weakness and light-headedness.    Physical Exam Updated Vital Signs BP 119/72 (BP Location: Right Arm)   Pulse (!) 57   Temp 98.4 F (36.9 C) (Oral)   Resp 16   Ht 5\' 5"  (1.651 m)   Wt 74.8 kg   SpO2 99%   BMI 27.46 kg/m   Physical Exam Vitals and nursing note reviewed.  Constitutional:      General: She is not in acute distress.    Appearance: She is well-developed.  HENT:     Head: Normocephalic and atraumatic.     Nose: Nose normal.  Eyes:     General: No scleral icterus.       Left eye: No discharge.     Conjunctiva/sclera: Conjunctivae normal.  Cardiovascular:     Rate and Rhythm: Normal rate and regular rhythm.     Heart sounds: Normal heart sounds. No murmur heard.  No friction rub. No gallop.   Pulmonary:     Effort: Pulmonary effort is normal. No respiratory distress.     Breath sounds: Normal breath sounds.  Abdominal:     General: Bowel sounds are normal. There is no distension.     Palpations: Abdomen is soft.     Tenderness: There is abdominal tenderness in the left upper quadrant and left lower quadrant. There is no guarding.  Musculoskeletal:        General: Normal range of motion.     Cervical back: Normal range of motion and neck supple.  Skin:    General: Skin is warm and dry.     Findings: No rash.  Neurological:     Mental Status: She is alert.     Motor: No abnormal muscle tone.     Coordination: Coordination normal.     ED Results / Procedures / Treatments   Labs (all labs ordered are listed, but only abnormal results are displayed) Labs Reviewed  COMPREHENSIVE METABOLIC PANEL - Abnormal; Notable for the following components:      Result Value   Glucose, Bld 102 (*)    All other components within normal limits  CBC - Abnormal; Notable for the following components:   Hemoglobin 11.0 (*)    HCT 34.2 (*)    All other components within normal limits  LIPASE,  BLOOD  URINALYSIS, ROUTINE W REFLEX MICROSCOPIC    EKG None  Radiology CT ABDOMEN PELVIS W CONTRAST  Result Date: 06/27/2020 CLINICAL DATA:  History of fistula. Left lower quadrant pain. Hematuria. EXAM: CT ABDOMEN AND PELVIS WITH CONTRAST TECHNIQUE: Multidetector CT imaging of the abdomen and pelvis was performed using the standard protocol following bolus administration of intravenous contrast. CONTRAST:  06/29/2020 OMNIPAQUE IOHEXOL 300 MG/ML  SOLN COMPARISON:  04/26/2020 FINDINGS: Lower chest: No acute abnormality. Hepatobiliary: No focal liver abnormality is seen. No gallstones, gallbladder wall thickening, or biliary dilatation. Pancreas: Unremarkable. No pancreatic ductal dilatation or surrounding inflammatory changes. Spleen: Normal in size without focal abnormality. Adrenals/Urinary Tract: Normal appearance of the adrenal glands. The kidneys  are unremarkable. No hydronephrosis or mass identified. Bladder appears unremarkable. No signs of colovesical fistula. Stomach/Bowel: Small hiatal hernia noted. The small bowel loops appear nondilated. No small bowel wall thickening, inflammation or distension. The appendix is visualized and appears normal. Extensive left-sided and distal colonic diverticulosis. Asymmetric wall thickening involving a loop of pelvic sigmoid colon is again a noted with evidence of colo colonic fistula that is a shin. The appearance is similar to the previous exam, image 67/6. No signs of bowel obstruction. No drainable abscess identified. Vascular/Lymphatic: Mild aortic atherosclerosis without aneurysm. No abdominal or pelvic adenopathy. Reproductive: Uterus and bilateral adnexa are unremarkable. Other: No significant free fluid or fluid collections. No pneumoperitoneum. Musculoskeletal: Signs of lumbar degenerative disc disease and lower lumbar spine facet arthropathy. IMPRESSION: 1. Similar appearance of wall thickening within a segment of mid sigmoid colon with signs of underlying  colocolonic fistula. Given the appearance of extensive diverticulosis involving the sigmoid colon this is favored to represent the sequelae of diverticulitis. 2. No significant free fluid or focal fluid collections identified within the abdomen or pelvis to suggest abscess. 3. Aortic atherosclerosis. Aortic Atherosclerosis (ICD10-I70.0). Electronically Signed   By: Signa Kell M.D.   On: 06/27/2020 12:01    Procedures Procedures (including critical care time)  Medications Ordered in ED Medications  HYDROmorphone (DILAUDID) injection 1 mg (1 mg Intravenous Given 06/27/20 1043)  sodium chloride 0.9 % bolus 1,000 mL (1,000 mLs Intravenous New Bag/Given 06/27/20 1042)  ondansetron (ZOFRAN) injection 4 mg (4 mg Intravenous Given 06/27/20 1043)  iohexol (OMNIPAQUE) 300 MG/ML solution 100 mL (100 mLs Intravenous Contrast Given 06/27/20 1121)    ED Course  I have reviewed the triage vital signs and the nursing notes.  Pertinent labs & imaging results that were available during my care of the patient were reviewed by me and considered in my medical decision making (see chart for details).    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  Reports left lower quadrant abdominal pain since yesterday.  Pain is sharp and unrelieved with her home oxycodone.  Reports nausea and nonbloody emesis.  Noticed "flecks of blood" in her urine today.  Denies any bloody stools or dysuria.  On exam there is tenderness palpation of the left lower quadrant without rebound or guarding.  She is afebrile without recent use of antipyretics.  Vital signs are within normal limits.  Work-up here significant for  unremarkable CBC, CMP, lipase.  Urinalysis without signs of hematuria or infection.  CT of the abdomen pelvis shows similar appearance over colocolic fistula with probable diverticular source.  No evidence of abscess or free fluid. On recheck patient's abdomen without any tenderness. She states she came to the ED today because she was hoping we would be able to get her surgery in sooner. I told patient that she will need to contact the general surgeons team to have this arranged if they feel that it is necessary.  However there is no evidence of any worsening disease today, no evidence of an abscess.  She remains hemodynamically stable here.  She is in a pain management program  and receives oxycodone monthly.  We have controlled her symptoms here.  She will need to follow-up and return for any worsening symptoms. Patient discussed with and seen by the attending, Dr. Bernette MayersSheldon.   Patient is hemodynamically stable, in NAD, and able to ambulate in the ED. Evaluation does not show pathology that would require ongoing emergent intervention or inpatient treatment. I explained the diagnosis to the patient. Pain has been managed and has no complaints prior to discharge. Patient is comfortable with above plan and is stable for discharge at this time. All questions were answered prior to disposition. Strict return precautions for returning to the ED were discussed. Encouraged follow up with PCP.   An After Visit Summary was printed and given to the patient.   Portions of this note were generated with Scientist, clinical (histocompatibility and immunogenetics)Dragon dictation software. Dictation errors may occur despite best attempts at proofreading.  Final Clinical Impression(s) / ED Diagnoses Final diagnoses:  Colonic fistula    Rx / DC Orders ED Discharge Orders    None       Dietrich PatesKhatri, Arjun Hard, PA-C 06/27/20 1225    Pollyann SavoySheldon, Charles B, MD 06/27/20 1451

## 2020-06-27 NOTE — Discharge Instructions (Signed)
You will need to follow-up with your surgeon in the clinic. Your CT scan did not show any evidence of abscess, fluid collection or any worsening of your fistula. Continue taking your home pain medication. Return to the ER if you develop a fever, chest pain, shortness of breath.

## 2020-06-27 NOTE — ED Notes (Signed)
Patient's daughter, Lacora, wanted an update.  Informed her that we are just waiting for the CT scan result.  Daughter informed me that patient is supposed to have a surgery.  I informed her that her mother told me about it.  Daughter stated asked me if her mother will I a surgery.  Informed her not IF she does, she will be transferred to another hospital.  She verbalized understanding.

## 2020-06-27 NOTE — ED Triage Notes (Addendum)
LLQ pain since yesterday. Endorses hematuria. She is writhing in pain. She was supposed to have surgery for colonic fistula but had to cancel.

## 2020-07-01 ENCOUNTER — Ambulatory Visit: Payer: Self-pay | Admitting: General Surgery

## 2020-07-19 NOTE — Patient Instructions (Addendum)
DUE TO COVID-19 ONLY ONE VISITOR IS ALLOWED TO COME WITH YOU AND STAY IN THE WAITING ROOM ONLY DURING PRE OP AND PROCEDURE DAY OF SURGERY. THE 2 VISITORS MAY VISIT WITH YOU AFTER SURGERY IN YOUR PRIVATE ROOM DURING VISITING HOURS ONLY!  YOU NEED TO HAVE A COVID 19 TEST ON: 07/27/20 @ 10:00 am , THIS TEST MUST BE DONE BEFORE SURGERY,  COVID TESTING SITE [address] [place] [id number], IT IS ON THE RIGHT GOING OUT [address] APPROXIMATELY  2 MINUTES PAST [employer] ON THE RIGHT. ONCE YOUR COVID TEST IS COMPLETED,  PLEASE BEGIN THE QUARANTINE INSTRUCTIONS AS OUTLINED IN YOUR HANDOUT.                Natalie Nancy MarusVirginia Adelson    Your procedure is scheduled on: 07/31/20   [facility]   Report to admitting at: 6:30 AM     Call this number if you have problems the morning of surgery [phone]    Remember:   DRINK 2 PRESURGERY ENSURE DRINKS THE NIGHT BEFORE SURGERY AT  1000 PM AND 1 PRESURGERY DRINK THE DAY OF THE PROCEDURE 3 HOURS PRIOR TO SCHEDULED SURGERY. NO SOLIDS AFTER MIDNIGHT THE DAY PRIOR TO THE SURGERY. NOTHING BY MOUTH EXCEPT CLEAR LIQUIDS UNTIL THREE HOURS PRIOR TO SCHEDULED SURGERY. PLEASE FINISH PRESURGERY ENSURE DRINK PER SURGEON ORDER 3 HOURS PRIOR TO SCHEDULED SURGERY TIME WHICH NEEDS TO BE COMPLETED AT: 5:30 AM.  CLEAR LIQUID DIET   Foods Allowed                                                                     Foods Excluded  Coffee and tea, regular and decaf                             liquids that you cannot  Plain Jell-O any favor except red or purple                                           see through such as: Fruit ices (not with fruit pulp)                                     milk, soups, orange juice  Iced Popsicles                                    All solid food Carbonated beverages, regular and diet                                    Cranberry, grape and apple juices Sports drinks like  Gatorade Lightly seasoned clear broth or consume(fat free) Sugar, honey syrup  Sample Menu Breakfast  Lunch                                     Supper Cranberry juice                    Beef broth                            Chicken broth Jell-O                                     Grape juice                           Apple juice Coffee or tea                        Jell-O                                      Popsicle                                                Coffee or tea                        Coffee or tea  _____________________________________________________________________  MAKE SURE YOU DRINK PLENTY FLUIDS THE DAY OF THE PREP.   BRUSH YOUR TEETH MORNING OF SURGERY AND RINSE YOUR MOUTH OUT, NO CHEWING GUM CANDY OR MINTS.     Take these medicines the morning of surgery with A SIP OF WATER: Fluoxetine.                                You may not have any metal on your body including hair pins and              piercings  Do not wear jewelry, make-up, lotions, powders or perfumes, deodorant             Do not wear nail polish on your fingernails.  Do not shave  48 hours prior to surgery.               Do not bring valuables to the hospital. [facility] IS NOT             RESPONSIBLE   FOR VALUABLES.  Contacts, dentures or bridgework may not be worn into surgery.  Leave suitcase in the car. After surgery it may be brought to your room.     Patients discharged the day of surgery will not be allowed to drive home. IF YOU ARE HAVING SURGERY AND GOING HOME THE SAME DAY, YOU MUST HAVE AN ADULT TO DRIVE YOU HOME AND BE WITH YOU FOR 24 HOURS. YOU MAY GO HOME BY TAXI OR UBER OR ORTHERWISE, BUT AN ADULT MUST ACCOMPANY YOU HOME AND STAY WITH YOU FOR 24 HOURS.  Name and phone number of your driver:  Special Instructions: N/A  Please read over the following fact sheets you were  given: _____________________________________________________________________  [facility] - Preparing for Surgery Before surgery, you can play an important role.  Because skin is not sterile, your skin needs to be as free of germs as possible.  You can reduce the number of germs on your skin by washing with CHG (chlorahexidine gluconate) soap before surgery.  CHG is an antiseptic cleaner which kills germs and bonds with the skin to continue killing germs even after washing. Please DO NOT use if you have an allergy to CHG or antibacterial soaps.  If your skin becomes reddened/irritated stop using the CHG and inform your nurse when you arrive at Short Stay. Do not shave (including legs and underarms) for at least 48 hours prior to the first CHG shower.  You may shave your face/neck. Please follow these instructions carefully:  1.  Shower with CHG Soap the night before surgery and the  morning of Surgery.  2.  If you choose to wash your hair, wash your hair first as usual with your  normal  shampoo.  3.  After you shampoo, rinse your hair and body thoroughly to remove the  shampoo.                           4.  Use CHG as you would any other liquid soap.  You can apply chg directly  to the skin and wash                       Gently with a scrungie or clean washcloth.  5.  Apply the CHG Soap to your body ONLY FROM THE NECK DOWN.   Do not use on face/ open                           Wound or open sores. Avoid contact with eyes, ears mouth and genitals (private parts).                       Wash face,  Genitals (private parts) with your normal soap.             6.  Wash thoroughly, paying special attention to the area where your surgery  will be performed.  7.  Thoroughly rinse your body with warm water from the neck down.  8.  DO NOT shower/wash with your normal soap after using and rinsing off  the CHG Soap.                9.  Pat yourself dry with a clean towel.            10.  Wear clean pajamas.             11.  Place clean sheets on your bed the night of your first shower and do not  sleep with pets. Day of Surgery : Do not apply any lotions/deodorants the morning of surgery.  Please wear clean clothes to the hospital/surgery center.  FAILURE TO FOLLOW THESE INSTRUCTIONS MAY RESULT IN THE CANCELLATION OF YOUR SURGERY PATIENT SIGNATURE_________________________________  NURSE SIGNATURE__________________________________  ________________________________________________________________________   Natalie Howard  An incentive spirometer is a tool that can help keep your lungs clear and active. This tool measures how well you are filling your lungs with each breath. Taking long deep breaths may help reverse or decrease the chance of  developing breathing (pulmonary) problems (especially infection) following:  A long period of time when you are unable to move or be active. BEFORE THE PROCEDURE   If the spirometer includes an indicator to show your best effort, your nurse or respiratory therapist will set it to a desired goal.  If possible, sit up straight or lean slightly forward. Try not to slouch.  Hold the incentive spirometer in an upright position. INSTRUCTIONS FOR USE  1. Sit on the edge of your bed if possible, or sit up as far as you can in bed or on a chair. 2. Hold the incentive spirometer in an upright position. 3. Breathe out normally. 4. Place the mouthpiece in your mouth and seal your lips tightly around it. 5. Breathe in slowly and as deeply as possible, raising the piston or the ball toward the top of the column. 6. Hold your breath for 3-5 seconds or for as long as possible. Allow the piston or ball to fall to the bottom of the column. 7. Remove the mouthpiece from your mouth and breathe out normally. 8. Rest for a few seconds and repeat Steps 1 through 7 at least 10 times every 1-2 hours when you are awake. Take your time and take a few normal breaths between deep  breaths. 9. The spirometer may include an indicator to show your best effort. Use the indicator as a goal to work toward during each repetition. 10. After each set of 10 deep breaths, practice coughing to be sure your lungs are clear. If you have an incision (the cut made at the time of surgery), support your incision when coughing by placing a pillow or rolled up towels firmly against it. Once you are able to get out of bed, walk around indoors and cough well. You may stop using the incentive spirometer when instructed by your caregiver.  RISKS AND COMPLICATIONS  Take your time so you do not get dizzy or light-headed.  If you are in pain, you may need to take or ask for pain medication before doing incentive spirometry. It is harder to take a deep breath if you are having pain. AFTER USE  Rest and breathe slowly and easily.  It can be helpful to keep track of a log of your progress. Your caregiver can provide you with a simple table to help with this. If you are using the spirometer at home, follow these instructions: SEEK MEDICAL CARE IF:   You are having difficultly using the spirometer.  You have trouble using the spirometer as often as instructed.  Your pain medication is not giving enough relief while using the spirometer.  You develop fever of 100.5 F (38.1 C) or higher. SEEK IMMEDIATE MEDICAL CARE IF:   You cough up bloody sputum that had not been present before.  You develop fever of 102 F (38.9 C) or greater.  You develop worsening pain at or near the incision site. MAKE SURE YOU:   Understand these instructions.  Will watch your condition.  Will get help right away if you are not doing well or get worse. Document Released: 04/12/2007 Document Revised: 02/22/2012 Document Reviewed: 06/13/2007 [facility] Patient Information 2014 [place], [place].   ________________________________________________________________________

## 2020-07-22 ENCOUNTER — Encounter (HOSPITAL_COMMUNITY)
Admission: RE | Admit: 2020-07-22 | Discharge: 2020-07-22 | Disposition: A | Discharge status: Home / Self Care | Payer: Medicare Other | Source: Ambulatory Visit | Attending: General Surgery | Admitting: General Surgery

## 2020-07-22 ENCOUNTER — Encounter (HOSPITAL_COMMUNITY): Payer: Self-pay

## 2020-07-22 ENCOUNTER — Other Ambulatory Visit: Payer: Self-pay

## 2020-07-22 DIAGNOSIS — Z01818 Encounter for other preprocedural examination: Secondary | ICD-10-CM | POA: Diagnosis present

## 2020-07-22 LAB — COMPREHENSIVE METABOLIC PANEL
ALT: 10 U/L (ref 0–44)
AST: 13 U/L — ABNORMAL LOW (ref 15–41)
Albumin: 3.8 g/dL (ref 3.5–5.0)
Alkaline Phosphatase: 80 U/L (ref 38–126)
Anion gap: 7 (ref 5–15)
BUN: 13 mg/dL (ref 8–23)
CO2: 24 mmol/L (ref 22–32)
Calcium: 9.1 mg/dL (ref 8.9–10.3)
Chloride: 110 mmol/L (ref 98–111)
Creatinine, Ser: 0.82 mg/dL (ref 0.44–1.00)
GFR calc Af Amer: >60 mL/min (ref >60)
GFR calc non Af Amer: >60 mL/min (ref >60)
Glucose, Bld: 103 mg/dL — ABNORMAL HIGH (ref 70–99)
Potassium: 3.8 mmol/L (ref 3.5–5.1)
Sodium: 141 mmol/L (ref 135–145)
Total Bilirubin: 0.3 mg/dL (ref 0.3–1.2)
Total Protein: 7.2 g/dL (ref 6.5–8.1)

## 2020-07-22 LAB — CBC
HCT: 34 % — ABNORMAL LOW (ref 36.0–46.0)
Hemoglobin: 11 g/dL — ABNORMAL LOW (ref 12.0–15.0)
MCH: 27.4 pg (ref 26.0–34.0)
MCHC: 32.4 g/dL (ref 30.0–36.0)
MCV: 84.6 fL (ref 80.0–100.0)
Platelets: 230 10*3/uL (ref 150–400)
RBC: 4.02 MIL/uL (ref 3.87–5.11)
RDW: 14.7 % (ref 11.5–15.5)
WBC: 4.2 10*3/uL (ref 4.0–10.5)
nRBC: 0 % (ref 0.0–0.2)

## 2020-07-22 LAB — GLUCOSE, CAPILLARY: Glucose-Capillary: 96 mg/dL (ref 70–99)

## 2020-07-22 LAB — HEMOGLOBIN A1C
Hgb A1c MFr Bld: 6.3 % — ABNORMAL HIGH (ref 4.8–5.6)
Mean Plasma Glucose: 134.11 mg/dL

## 2020-07-22 NOTE — Progress Notes (Signed)
COVID Vaccine Completed:yes Date COVID Vaccine completed:01/30/20 COVID vaccine manufacturer: *Pfizer    Moderna   [employer]   PCP - Dr. Willey Blade Cardiologist - No  Chest x-ray -  EKG -  Stress Test -  ECHO -  Cardiac Cath -   Sleep Study -  CPAP -   Fasting Blood Sugar - 80's Checks Blood Sugar __2_ times a day  Blood Thinner Instructions: Aspirin Instructions: Last Dose:  Anesthesia review:   Patient denies shortness of breath, fever, cough and chest pain at PAT appointment   Patient verbalized understanding of instructions that were given to them at the PAT appointment. Patient was also instructed that they will need to review over the PAT instructions again at home before surgery.

## 2020-07-27 ENCOUNTER — Telehealth: Payer: Self-pay | Admitting: Surgery

## 2020-07-27 ENCOUNTER — Inpatient Hospital Stay (HOSPITAL_COMMUNITY): Admission: RE | Admit: 2020-07-27 | Payer: Medicare Other | Source: Ambulatory Visit

## 2020-07-27 NOTE — Telephone Encounter (Signed)
Natalie Howard [name]  1951-07-01 [id number]  Patient Care Team: Gwenyth Bender, MD as PCP - General (Internal Medicine) York Spaniel, MD as Consulting Physician (Neurology) Karie Soda, MD as Consulting Physician (General Surgery) Charlott Rakes, MD as Consulting Physician (Gastroenterology) Andria Meuse, MD as Consulting Physician (General Surgery)  This patient is a 69 y.o.female who calls today   Patient called today frustrated.  Has significant diverticulitis and due for colon resection next Wednesday by Dr. Maisie Fus with our group.Marland Kitchen  Has transportation issues.  Transportation arranged to have Covid test done on [address] this morning.  Transportation company sent her to [employer], insisting it could be done there.  It could not.  Patient now back home and worried that she is not be able to get her surgery.  She cannot reach anyone through the [facility] health system at this time.  I recommend she call [facility] preop center when they are open on Monday and see if she can get a Covid test done preop so her colon resection is not delayed again.  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   Acute diverticulitis 04/26/2020   Tenosynovitis of right wrist 10/02/2019   Diabetes mellitus type 2 without retinopathy (HCC) 01/13/2018   Long term current use of oral hypoglycemic drug 01/13/2018   Nuclear sclerotic cataract of both eyes 01/13/2018   Posterior vitreous detachment of both eyes 01/13/2018   Hyperopia of both eyes with astigmatism and presbyopia 01/13/2018   Gait abnormality 10/29/2017   Paresthesia 10/29/2017    Past Medical History:  Diagnosis Date   Anemia    Depression     Diabetes (HCC)    Gait abnormality 10/29/2017    Past Surgical History:  Procedure Laterality Date   BIOPSY  04/28/2020   Procedure: BIOPSY;  Surgeon: Charlott Rakes, MD;  Location: [facility];  Service: Endoscopy;;   CESAREAN SECTION     FLEXIBLE SIGMOIDOSCOPY N/A 04/28/2020   Procedure: Arnell Sieving;  Surgeon: Charlott Rakes, MD;  Location: [facility];  Service: Endoscopy;  Laterality: N/A;   SUBMUCOSAL TATTOO INJECTION  04/28/2020   Procedure: SUBMUCOSAL TATTOO INJECTION;  Surgeon: Charlott Rakes, MD;  Location: [facility];  Service: Endoscopy;;    Social History   Socioeconomic History   Marital status: Widowed    Spouse name: Not on file   Number of children: 1   Years of education: 65   Highest education level: Not on file  Occupational History   Occupation: Retired  Tobacco Use   Smoking status: Former Smoker   Smokeless tobacco: Never Used  Building services engineer Use: Never used  Substance and Sexual Activity   Alcohol use: No    Comment: Hx alcohol abuse   Drug use: No   Sexual activity: Not on file  Other Topics Concern   Not on file  Social History Narrative   Lives with daughter   Caffeine use: 1 cup Sunday daily   Social Determinants of Health   Financial Resource Strain:    Difficulty of Paying Living Expenses:   Food Insecurity:    Worried About Programme researcher, broadcasting/film/video in the Last Year:  Ran Out of Food in the Last Year:   Transportation Needs:    Freight forwarder (Medical):    Lack of Transportation (Non-Medical):   Physical Activity:    Days of Exercise per Week:    Minutes of Exercise per Session:   Stress:    Feeling of Stress :   Social Connections:    Frequency of Communication with Friends and Family:    Frequency of Social Gatherings with Friends and Family:    Attends Religious Services:    Active Member of Clubs or Organizations:    Attends Engineer, structural:    Marital Status:   Intimate  Partner Violence:    Fear of Current or Ex-Partner:    Emotionally Abused:    Physically Abused:    Sexually Abused:     Family History  Problem Relation Age of Onset   Diabetes Mother    Cancer - Colon Father    Diabetes Sister    Heart disease Brother    Renal Disease Brother     Current Outpatient Medications  Medication Sig Dispense Refill   ammonium lactate (LAC-HYDRIN) 12 % lotion Apply 1 application topically as needed for dry skin.     FLUoxetine (PROZAC) 20 MG capsule Take 20 mg by mouth daily.     oxyCODONE (OXY IR/ROXICODONE) 5 MG immediate release tablet Take 5 mg by mouth 4 (four) times daily as needed.     polyethylene glycol (MIRALAX / GLYCOLAX) 17 g packet Take 17 g by mouth daily. (Patient not taking: Reported on 07/12/2020) 14 each 0   No current facility-administered medications for this visit.   Facility-Administered Medications Ordered in Other Visits  Medication Dose Route Frequency Provider Last Rate Last Admin   clindamycin (CLEOCIN) 900 mg in dextrose 5 % 50 mL IVPB  900 mg Intravenous 60 min Pre-Op Romie Levee, MD       And   gentamicin (GARAMYCIN) 5 mg/kg in dextrose 5 % 50 mL IVPB  5 mg/kg Intravenous 60 min Pre-Op Romie Levee, MD         Allergies  Allergen Reactions   Epitol [Carbamazepine] Itching   Gluten Meal     Unknown   Mold Extract [Trichophyton]     Runny nose    Penicillins Hives    @VS @  CT ABDOMEN PELVIS W CONTRAST  Result Date: 06/27/2020 CLINICAL DATA:  History of fistula. Left lower quadrant pain. Hematuria. EXAM: CT ABDOMEN AND PELVIS WITH CONTRAST TECHNIQUE: Multidetector CT imaging of the abdomen and pelvis was performed using the standard protocol following bolus administration of intravenous contrast. CONTRAST:  06/29/2020 OMNIPAQUE IOHEXOL 300 MG/ML  SOLN COMPARISON:  04/26/2020 FINDINGS: Lower chest: No acute abnormality. Hepatobiliary: No focal liver abnormality is seen. No gallstones, gallbladder wall thickening, or  biliary dilatation. Pancreas: Unremarkable. No pancreatic ductal dilatation or surrounding inflammatory changes. Spleen: Normal in size without focal abnormality. Adrenals/Urinary Tract: Normal appearance of the adrenal glands. The kidneys are unremarkable. No hydronephrosis or mass identified. Bladder appears unremarkable. No signs of colovesical fistula. Stomach/Bowel: Small hiatal hernia noted. The small bowel loops appear nondilated. No small bowel wall thickening, inflammation or distension. The appendix is visualized and appears normal. Extensive left-sided and distal colonic diverticulosis. Asymmetric wall thickening involving a loop of pelvic sigmoid colon is again a noted with evidence of colo colonic fistula that is a shin. The appearance is similar to the previous exam, image 67/6. No signs of bowel obstruction. No drainable abscess identified. Vascular/Lymphatic: Mild  aortic atherosclerosis without aneurysm. No abdominal or pelvic adenopathy. Reproductive: Uterus and bilateral adnexa are unremarkable. Other: No significant free fluid or fluid collections. No pneumoperitoneum. Musculoskeletal: Signs of lumbar degenerative disc disease and lower lumbar spine facet arthropathy. IMPRESSION: 1. Similar appearance of wall thickening within a segment of mid sigmoid colon with signs of underlying colocolonic fistula. Given the appearance of extensive diverticulosis involving the sigmoid colon this is favored to represent the sequelae of diverticulitis. 2. No significant free fluid or focal fluid collections identified within the abdomen or pelvis to suggest abscess. 3. Aortic atherosclerosis. Aortic Atherosclerosis (ICD10-I70.0). Electronically Signed   By: Signa Kell M.D.   On: 06/27/2020 12:01    Note:  This dictation was prepared with Dragon/digital dictation along with [employer]. Any transcriptional errors that result from this process are unintentional.   .Ardeth Sportsman, M.D.,  F.A.C.S. Gastrointestinal and Minimally Invasive Surgery [facility] [address] [place], [place] [address] [phone] Main / Paging  07/27/2020 10:50 AM

## 2020-07-29 NOTE — Progress Notes (Signed)
Spoke to [place] in surgeon's office. Pt was taken to incorrect  COVID test site on Saturday 8/14 by public transportation provider, and as result was unable to have her COVID test performed. Pt does not have public transportation to another [facility] approved testing site, and will require a rapid COVID test on day of surgery.   Spoke to [place], Charity fundraiser in [place]. Per Kenney Houseman, pt will need to arrive 3 hours prior to surgery in lieu of 2 hours. Thus patient to arrive at 5:30 AM on 07/31/20 in lieu of 6:30 AM. Marylene Land in surgeon's office will communicate new arrival time to patient.

## 2020-07-30 MED ORDER — BUPIVACAINE LIPOSOME 1.3 % IJ SUSP
20.0000 mL | Freq: Once | INTRAMUSCULAR | Status: DC
  Filled 2020-07-30: qty 20

## 2020-07-31 ENCOUNTER — Inpatient Hospital Stay (HOSPITAL_COMMUNITY): Payer: Medicare Other | Admitting: Certified Registered Nurse Anesthetist

## 2020-07-31 ENCOUNTER — Encounter (HOSPITAL_COMMUNITY)
Admission: RE | Disposition: A | Discharge status: Home Health | Payer: Self-pay | Source: Home / Self Care | Attending: General Surgery

## 2020-07-31 ENCOUNTER — Other Ambulatory Visit: Payer: Self-pay

## 2020-07-31 ENCOUNTER — Inpatient Hospital Stay (HOSPITAL_COMMUNITY)
Admission: RE | Admit: 2020-07-31 | Discharge: 2020-08-03 | DRG: 330 | Disposition: A | Discharge status: Home Health | Payer: Medicare Other | Attending: General Surgery | Admitting: General Surgery

## 2020-07-31 DIAGNOSIS — Z20822 Contact with and (suspected) exposure to covid-19: Secondary | ICD-10-CM | POA: Diagnosis present

## 2020-07-31 DIAGNOSIS — E119 Type 2 diabetes mellitus without complications: Secondary | ICD-10-CM | POA: Diagnosis present

## 2020-07-31 DIAGNOSIS — N736 Female pelvic peritoneal adhesions (postinfective): Secondary | ICD-10-CM | POA: Diagnosis present

## 2020-07-31 DIAGNOSIS — R110 Nausea: Secondary | ICD-10-CM | POA: Diagnosis not present

## 2020-07-31 DIAGNOSIS — K632 Fistula of intestine: Secondary | ICD-10-CM | POA: Diagnosis present

## 2020-07-31 DIAGNOSIS — F329 Major depressive disorder, single episode, unspecified: Secondary | ICD-10-CM | POA: Diagnosis present

## 2020-07-31 DIAGNOSIS — K5790 Diverticulosis of intestine, part unspecified, without perforation or abscess without bleeding: Secondary | ICD-10-CM | POA: Diagnosis present

## 2020-07-31 LAB — SARS CORONAVIRUS 2 BY RT PCR (HOSPITAL ORDER, PERFORMED IN ~~LOC~~ HOSPITAL LAB): SARS Coronavirus 2: NEGATIVE

## 2020-07-31 LAB — GLUCOSE, CAPILLARY
Glucose-Capillary: 115 mg/dL — ABNORMAL HIGH (ref 70–99)
Glucose-Capillary: 175 mg/dL — ABNORMAL HIGH (ref 70–99)

## 2020-07-31 SURGERY — COLECTOMY, PARTIAL, ROBOT-ASSISTED, LAPAROSCOPIC
Anesthesia: General

## 2020-07-31 MED ORDER — ALVIMOPAN 12 MG PO CAPS
12.0000 mg | ORAL_CAPSULE | ORAL | Status: AC
  Administered 2020-07-31: 12 mg via ORAL
  Filled 2020-07-31: qty 1

## 2020-07-31 MED ORDER — LABETALOL HCL 5 MG/ML IV SOLN: INTRAVENOUS | Status: DC | PRN

## 2020-07-31 MED ORDER — DEXAMETHASONE SODIUM PHOSPHATE 10 MG/ML IJ SOLN
INTRAMUSCULAR | Status: AC
  Filled 2020-07-31: qty 1

## 2020-07-31 MED ORDER — ONDANSETRON HCL 4 MG/2ML IJ SOLN
4.0000 mg | Freq: Four times a day (QID) | INTRAMUSCULAR | Status: DC | PRN
  Administered 2020-07-31: 13:00:00 4 mg via INTRAVENOUS
  Filled 2020-07-31: qty 2

## 2020-07-31 MED ORDER — AMISULPRIDE (ANTIEMETIC) 5 MG/2ML IV SOLN: 10.0000 mg | Freq: Once | INTRAVENOUS | Status: DC | PRN

## 2020-07-31 MED ORDER — LIDOCAINE 2% (20 MG/ML) 5 ML SYRINGE
INTRAMUSCULAR | Status: DC | PRN
  Administered 2020-07-31: 1 mg/kg/h via INTRAVENOUS

## 2020-07-31 MED ORDER — ROCURONIUM BROMIDE 10 MG/ML (PF) SYRINGE
PREFILLED_SYRINGE | INTRAVENOUS | Status: AC
  Filled 2020-07-31: qty 10

## 2020-07-31 MED ORDER — ENSURE SURGERY PO LIQD
237.0000 mL | Freq: Two times a day (BID) | ORAL | Status: DC
  Administered 2020-08-01 – 2020-08-02 (×4): 237 mL via ORAL

## 2020-07-31 MED ORDER — HYDROMORPHONE HCL 1 MG/ML IJ SOLN
0.5000 mg | INTRAMUSCULAR | Status: DC | PRN
  Administered 2020-07-31 – 2020-08-01 (×4): 0.5 mg via INTRAVENOUS
  Filled 2020-07-31 (×5): qty 0.5

## 2020-07-31 MED ORDER — CLINDAMYCIN PHOSPHATE 900 MG/50ML IV SOLN
900.0000 mg | Freq: Three times a day (TID) | INTRAVENOUS | Status: AC
  Administered 2020-07-31: 17:00:00 900 mg via INTRAVENOUS
  Filled 2020-07-31: qty 50

## 2020-07-31 MED ORDER — LIDOCAINE 2% (20 MG/ML) 5 ML SYRINGE
INTRAMUSCULAR | Status: DC | PRN
  Administered 2020-07-31: 60 mg via INTRAVENOUS

## 2020-07-31 MED ORDER — ALUM & MAG HYDROXIDE-SIMETH 200-200-20 MG/5ML PO SUSP: 30.0000 mL | Freq: Four times a day (QID) | ORAL | Status: DC | PRN

## 2020-07-31 MED ORDER — SACCHAROMYCES BOULARDII 250 MG PO CAPS
250.0000 mg | ORAL_CAPSULE | Freq: Two times a day (BID) | ORAL | Status: DC
  Administered 2020-07-31 – 2020-08-03 (×7): 250 mg via ORAL
  Filled 2020-07-31 (×7): qty 1

## 2020-07-31 MED ORDER — CHLORHEXIDINE GLUCONATE 0.12 % MT SOLN
15.0000 mL | Freq: Once | OROMUCOSAL | Status: AC
  Administered 2020-07-31: 15 mL via OROMUCOSAL

## 2020-07-31 MED ORDER — PHENYLEPHRINE 40 MCG/ML (10ML) SYRINGE FOR IV PUSH (FOR BLOOD PRESSURE SUPPORT)
PREFILLED_SYRINGE | INTRAVENOUS | Status: DC | PRN
  Administered 2020-07-31: 80 ug via INTRAVENOUS

## 2020-07-31 MED ORDER — PROPOFOL 10 MG/ML IV BOLUS
INTRAVENOUS | Status: DC | PRN
  Administered 2020-07-31: 130 mg via INTRAVENOUS

## 2020-07-31 MED ORDER — ONDANSETRON HCL 4 MG/2ML IJ SOLN
INTRAMUSCULAR | Status: AC
  Filled 2020-07-31: qty 2

## 2020-07-31 MED ORDER — ENSURE PRE-SURGERY PO LIQD
592.0000 mL | Freq: Once | ORAL | Status: DC
  Filled 2020-07-31: qty 592

## 2020-07-31 MED ORDER — PROPOFOL 10 MG/ML IV BOLUS
INTRAVENOUS | Status: AC
  Filled 2020-07-31: qty 20

## 2020-07-31 MED ORDER — ALVIMOPAN 12 MG PO CAPS
12.0000 mg | ORAL_CAPSULE | Freq: Two times a day (BID) | ORAL | Status: DC
  Administered 2020-08-01 (×2): 12 mg via ORAL
  Filled 2020-07-31 (×2): qty 1

## 2020-07-31 MED ORDER — CLINDAMYCIN PHOSPHATE 900 MG/50ML IV SOLN: 900.0000 mg | Freq: Once | INTRAVENOUS | Status: DC

## 2020-07-31 MED ORDER — FENTANYL CITRATE (PF) 250 MCG/5ML IJ SOLN
INTRAMUSCULAR | Status: DC | PRN
  Administered 2020-07-31: 50 ug via INTRAVENOUS
  Administered 2020-07-31: 100 ug via INTRAVENOUS
  Administered 2020-07-31 (×2): 50 ug via INTRAVENOUS

## 2020-07-31 MED ORDER — ONDANSETRON HCL 4 MG/2ML IJ SOLN
INTRAMUSCULAR | Status: DC | PRN
  Administered 2020-07-31: 4 mg via INTRAVENOUS

## 2020-07-31 MED ORDER — BUPIVACAINE-EPINEPHRINE 0.25% -1:200000 IJ SOLN
INTRAMUSCULAR | Status: AC
  Filled 2020-07-31: qty 1

## 2020-07-31 MED ORDER — LABETALOL HCL 5 MG/ML IV SOLN
INTRAVENOUS | Status: AC
  Filled 2020-07-31: qty 4

## 2020-07-31 MED ORDER — OXYCODONE HCL 5 MG/5ML PO SOLN: 5.0000 mg | Freq: Once | ORAL | Status: DC | PRN

## 2020-07-31 MED ORDER — BUPIVACAINE LIPOSOME 1.3 % IJ SUSP
INTRAMUSCULAR | Status: DC | PRN
  Administered 2020-07-31: 20 mL

## 2020-07-31 MED ORDER — PHENYLEPHRINE HCL (PRESSORS) 10 MG/ML IV SOLN
INTRAVENOUS | Status: AC
  Filled 2020-07-31: qty 1

## 2020-07-31 MED ORDER — FLUOXETINE HCL 20 MG PO CAPS
20.0000 mg | ORAL_CAPSULE | Freq: Every day | ORAL | Status: DC
  Administered 2020-08-01 – 2020-08-03 (×3): 20 mg via ORAL
  Filled 2020-07-31 (×3): qty 1

## 2020-07-31 MED ORDER — FENTANYL CITRATE (PF) 250 MCG/5ML IJ SOLN
INTRAMUSCULAR | Status: AC
  Filled 2020-07-31: qty 5

## 2020-07-31 MED ORDER — ACETAMINOPHEN 500 MG PO TABS
1000.0000 mg | ORAL_TABLET | Freq: Four times a day (QID) | ORAL | Status: DC
  Administered 2020-07-31 – 2020-08-03 (×10): 1000 mg via ORAL
  Filled 2020-07-31 (×12): qty 2

## 2020-07-31 MED ORDER — OXYCODONE HCL 5 MG PO TABS
5.0000 mg | ORAL_TABLET | ORAL | Status: DC | PRN
  Administered 2020-07-31 – 2020-08-01 (×5): 10 mg via ORAL
  Filled 2020-07-31 (×5): qty 2

## 2020-07-31 MED ORDER — LACTATED RINGERS IV SOLN: INTRAVENOUS | Status: DC | PRN

## 2020-07-31 MED ORDER — CHLORHEXIDINE GLUCONATE CLOTH 2 % EX PADS
6.0000 | MEDICATED_PAD | Freq: Every day | CUTANEOUS | Status: DC
  Administered 2020-07-31 – 2020-08-01 (×2): 6 via TOPICAL

## 2020-07-31 MED ORDER — ORAL CARE MOUTH RINSE: 15.0000 mL | Freq: Once | OROMUCOSAL | Status: AC

## 2020-07-31 MED ORDER — MEPERIDINE HCL 50 MG/ML IJ SOLN: 6.2500 mg | INTRAMUSCULAR | Status: DC | PRN

## 2020-07-31 MED ORDER — DEXAMETHASONE SODIUM PHOSPHATE 10 MG/ML IJ SOLN
INTRAMUSCULAR | Status: DC | PRN
  Administered 2020-07-31: 10 mg via INTRAVENOUS

## 2020-07-31 MED ORDER — METHYLENE BLUE 0.5 % INJ SOLN
INTRAVENOUS | Status: AC
  Filled 2020-07-31: qty 10

## 2020-07-31 MED ORDER — 0.9 % SODIUM CHLORIDE (POUR BTL) OPTIME
TOPICAL | Status: DC | PRN
  Administered 2020-07-31: 2000 mL

## 2020-07-31 MED ORDER — ENOXAPARIN SODIUM 40 MG/0.4ML ~~LOC~~ SOLN
40.0000 mg | SUBCUTANEOUS | Status: DC
  Administered 2020-08-01 – 2020-08-03 (×3): 40 mg via SUBCUTANEOUS
  Filled 2020-07-31 (×3): qty 0.4

## 2020-07-31 MED ORDER — LIDOCAINE 2% (20 MG/ML) 5 ML SYRINGE
INTRAMUSCULAR | Status: AC
  Filled 2020-07-31: qty 5

## 2020-07-31 MED ORDER — KCL IN DEXTROSE-NACL 20-5-0.45 MEQ/L-%-% IV SOLN
INTRAVENOUS | Status: DC
  Filled 2020-07-31 (×2): qty 1000

## 2020-07-31 MED ORDER — OXYCODONE HCL 5 MG PO TABS: 5.0000 mg | ORAL_TABLET | Freq: Once | ORAL | Status: DC | PRN

## 2020-07-31 MED ORDER — CLINDAMYCIN PHOSPHATE 900 MG/50ML IV SOLN
INTRAVENOUS | Status: AC
  Filled 2020-07-31: qty 50

## 2020-07-31 MED ORDER — ENSURE PRE-SURGERY PO LIQD
296.0000 mL | Freq: Once | ORAL | Status: DC
  Filled 2020-07-31: qty 296

## 2020-07-31 MED ORDER — GABAPENTIN 300 MG PO CAPS
300.0000 mg | ORAL_CAPSULE | Freq: Two times a day (BID) | ORAL | Status: DC
  Administered 2020-07-31 – 2020-08-01 (×2): 300 mg via ORAL
  Filled 2020-07-31 (×2): qty 1

## 2020-07-31 MED ORDER — SIMETHICONE 80 MG PO CHEW
40.0000 mg | CHEWABLE_TABLET | Freq: Four times a day (QID) | ORAL | Status: DC | PRN
  Administered 2020-08-01 (×2): 40 mg via ORAL
  Filled 2020-07-31 (×2): qty 1

## 2020-07-31 MED ORDER — BUPIVACAINE-EPINEPHRINE 0.25% -1:200000 IJ SOLN
INTRAMUSCULAR | Status: DC | PRN
  Administered 2020-07-31: 30 mL

## 2020-07-31 MED ORDER — MIDAZOLAM HCL 2 MG/2ML IJ SOLN
INTRAMUSCULAR | Status: AC
  Filled 2020-07-31: qty 2

## 2020-07-31 MED ORDER — KETAMINE HCL 10 MG/ML IJ SOLN
INTRAMUSCULAR | Status: DC | PRN
  Administered 2020-07-31: 40 mg via INTRAVENOUS

## 2020-07-31 MED ORDER — LABETALOL HCL 5 MG/ML IV SOLN
INTRAVENOUS | Status: DC | PRN
Start: 2020-07-31 — End: 2020-07-31
  Administered 2020-07-31: 5 mg via INTRAVENOUS

## 2020-07-31 MED ORDER — LACTATED RINGERS IR SOLN
Status: DC | PRN
  Administered 2020-07-31: 1000 mL

## 2020-07-31 MED ORDER — SPY AGENT GREEN - (INDOCYANINE FOR INJECTION)
INTRAMUSCULAR | Status: DC | PRN
  Administered 2020-07-31: 3 mL via INTRAVENOUS

## 2020-07-31 MED ORDER — GENTAMICIN SULFATE 40 MG/ML IJ SOLN
5.0000 mg/kg | Freq: Once | INTRAVENOUS | Status: AC
  Administered 2020-07-31: 320 mg via INTRAVENOUS
  Filled 2020-07-31: qty 8

## 2020-07-31 MED ORDER — MIDAZOLAM HCL 5 MG/5ML IJ SOLN
INTRAMUSCULAR | Status: DC | PRN
  Administered 2020-07-31: 2 mg via INTRAVENOUS

## 2020-07-31 MED ORDER — ROCURONIUM BROMIDE 10 MG/ML (PF) SYRINGE
PREFILLED_SYRINGE | INTRAVENOUS | Status: DC | PRN
  Administered 2020-07-31: 60 mg via INTRAVENOUS
  Administered 2020-07-31: 20 mg via INTRAVENOUS

## 2020-07-31 MED ORDER — GABAPENTIN 300 MG PO CAPS
300.0000 mg | ORAL_CAPSULE | ORAL | Status: AC
  Administered 2020-07-31: 300 mg via ORAL
  Filled 2020-07-31: qty 1

## 2020-07-31 MED ORDER — PROMETHAZINE HCL 25 MG/ML IJ SOLN: 6.2500 mg | INTRAMUSCULAR | Status: DC | PRN

## 2020-07-31 MED ORDER — LACTATED RINGERS IV SOLN: INTRAVENOUS | Status: DC

## 2020-07-31 MED ORDER — PHENYLEPHRINE HCL-NACL 10-0.9 MG/250ML-% IV SOLN
INTRAVENOUS | Status: DC | PRN
Start: 2020-07-31 — End: 2020-07-31
  Administered 2020-07-31: 50 ug/min via INTRAVENOUS

## 2020-07-31 MED ORDER — ONDANSETRON HCL 4 MG PO TABS: 4.0000 mg | ORAL_TABLET | Freq: Four times a day (QID) | ORAL | Status: DC | PRN

## 2020-07-31 MED ORDER — ACETAMINOPHEN 500 MG PO TABS
1000.0000 mg | ORAL_TABLET | ORAL | Status: AC
  Administered 2020-07-31: 1000 mg via ORAL
  Filled 2020-07-31: qty 2

## 2020-07-31 MED ORDER — HYDROMORPHONE HCL 1 MG/ML IJ SOLN: 0.2500 mg | INTRAMUSCULAR | Status: DC | PRN

## 2020-07-31 MED ORDER — SUGAMMADEX SODIUM 200 MG/2ML IV SOLN
INTRAVENOUS | Status: DC | PRN
  Administered 2020-07-31: 200 mg via INTRAVENOUS

## 2020-07-31 SURGICAL SUPPLY — 91 items
BLADE EXTENDED COATED 6.5IN (ELECTRODE) IMPLANT
CANNULA REDUC XI 12-8 STAPL (CANNULA) ×1
CANNULA REDUC XI 12-8MM STAPL (CANNULA) ×1
CANNULA REDUCER 12-8 DVNC XI (CANNULA) ×1 IMPLANT
CELLS DAT CNTRL 66122 CELL SVR (MISCELLANEOUS) IMPLANT
COVER SURGICAL LIGHT HANDLE (MISCELLANEOUS) ×6 IMPLANT
COVER TIP SHEARS 8 DVNC (MISCELLANEOUS) ×1 IMPLANT
COVER TIP SHEARS 8MM DA VINCI (MISCELLANEOUS) ×2
COVER WAND RF STERILE (DRAPES) ×3 IMPLANT
DECANTER SPIKE VIAL GLASS SM (MISCELLANEOUS) IMPLANT
DERMABOND ADVANCED (GAUZE/BANDAGES/DRESSINGS) ×2
DERMABOND ADVANCED .7 DNX12 (GAUZE/BANDAGES/DRESSINGS) ×1 IMPLANT
DRAIN CHANNEL 19F RND (DRAIN) ×3 IMPLANT
DRAPE ARM DVNC X/XI (DISPOSABLE) ×4 IMPLANT
DRAPE COLUMN DVNC XI (DISPOSABLE) ×1 IMPLANT
DRAPE CV SPLIT W-CLR ANES SCRN (DRAPES) ×3 IMPLANT
DRAPE DA VINCI XI ARM (DISPOSABLE) ×8
DRAPE DA VINCI XI COLUMN (DISPOSABLE) ×2
DRAPE PERI GROIN 82X75IN TIB (DRAPES) ×3 IMPLANT
DRAPE SURG IRRIG POUCH 19X23 (DRAPES) ×3 IMPLANT
DRSG OPSITE POSTOP 3X4 (GAUZE/BANDAGES/DRESSINGS) ×3 IMPLANT
DRSG OPSITE POSTOP 4X10 (GAUZE/BANDAGES/DRESSINGS) IMPLANT
DRSG OPSITE POSTOP 4X6 (GAUZE/BANDAGES/DRESSINGS) IMPLANT
DRSG OPSITE POSTOP 4X8 (GAUZE/BANDAGES/DRESSINGS) IMPLANT
ELECT PENCIL ROCKER SW 15FT (MISCELLANEOUS) ×3 IMPLANT
ELECT REM PT RETURN 15FT ADLT (MISCELLANEOUS) ×3 IMPLANT
ENDOLOOP SUT PDS II  0 18 (SUTURE)
ENDOLOOP SUT PDS II 0 18 (SUTURE) IMPLANT
EVACUATOR SILICONE 100CC (DRAIN) ×3 IMPLANT
GLOVE BIO SURGEON STRL SZ 6.5 (GLOVE) ×6 IMPLANT
GLOVE BIO SURGEONS STRL SZ 6.5 (GLOVE) ×3
GLOVE BIOGEL PI IND STRL 7.0 (GLOVE) ×2 IMPLANT
GLOVE BIOGEL PI INDICATOR 7.0 (GLOVE) ×4
GOWN STRL REUS W/TWL XL LVL3 (GOWN DISPOSABLE) ×27 IMPLANT
GRASPER SUT TROCAR 14GX15 (MISCELLANEOUS) IMPLANT
HOLDER FOLEY CATH W/STRAP (MISCELLANEOUS) ×3 IMPLANT
IRRIG SUCT STRYKERFLOW 2 WTIP (MISCELLANEOUS) ×3
IRRIGATION SUCT STRKRFLW 2 WTP (MISCELLANEOUS) ×1 IMPLANT
KIT PROCEDURE DA VINCI SI (MISCELLANEOUS) ×2
KIT PROCEDURE DVNC SI (MISCELLANEOUS) ×1 IMPLANT
KIT TURNOVER KIT A (KITS) ×3 IMPLANT
NEEDLE INSUFFLATION 14GA 120MM (NEEDLE) ×3 IMPLANT
PACK CARDIOVASCULAR III (CUSTOM PROCEDURE TRAY) IMPLANT
PACK COLON (CUSTOM PROCEDURE TRAY) ×3 IMPLANT
PAD POSITIONING PINK XL (MISCELLANEOUS) ×3 IMPLANT
PORT LAP GEL ALEXIS MED 5-9CM (MISCELLANEOUS) ×3 IMPLANT
RELOAD STAPLER 4.3X60 GRN DVNC (STAPLE) ×1 IMPLANT
RTRCTR WOUND ALEXIS 18CM MED (MISCELLANEOUS)
SCISSORS LAP 5X35 DISP (ENDOMECHANICALS) ×3 IMPLANT
SEAL CANN UNIV 5-8 DVNC XI (MISCELLANEOUS) ×4 IMPLANT
SEAL XI 5MM-8MM UNIVERSAL (MISCELLANEOUS) ×8
SEALER VESSEL DA VINCI XI (MISCELLANEOUS) ×2
SEALER VESSEL EXT DVNC XI (MISCELLANEOUS) ×1 IMPLANT
SOLUTION ELECTROLUBE (MISCELLANEOUS) ×3 IMPLANT
SPONGE LAP 18X18 RF (DISPOSABLE) ×3 IMPLANT
STAPLER 45 DA VINCI SURE FORM (STAPLE)
STAPLER 45 SUREFORM DVNC (STAPLE) IMPLANT
STAPLER 60 DA VINCI SURE FORM (STAPLE) ×2
STAPLER 60 SUREFORM DVNC (STAPLE) ×1 IMPLANT
STAPLER CANNULA SEAL DVNC XI (STAPLE) ×1 IMPLANT
STAPLER CANNULA SEAL XI (STAPLE) ×2
STAPLER RELOAD 4.3X60 GREEN (STAPLE) ×2
STAPLER RELOAD 4.3X60 GRN DVNC (STAPLE) ×1
STOPCOCK 4 WAY LG BORE MALE ST (IV SETS) IMPLANT
SUT ETHILON 2 0 PS N (SUTURE) ×3 IMPLANT
SUT NOVA NAB DX-16 0-1 5-0 T12 (SUTURE) ×9 IMPLANT
SUT PROLENE 2 0 KS (SUTURE) IMPLANT
SUT SILK 2 0 (SUTURE) ×2
SUT SILK 2 0 SH CR/8 (SUTURE) IMPLANT
SUT SILK 2-0 18XBRD TIE 12 (SUTURE) ×1 IMPLANT
SUT SILK 3 0 (SUTURE)
SUT SILK 3 0 SH CR/8 (SUTURE) ×3 IMPLANT
SUT SILK 3-0 18XBRD TIE 12 (SUTURE) IMPLANT
SUT V-LOC BARB 180 2/0GR6 GS22 (SUTURE)
SUT VIC AB 2-0 SH 18 (SUTURE) IMPLANT
SUT VIC AB 2-0 SH 27 (SUTURE) ×2
SUT VIC AB 2-0 SH 27X BRD (SUTURE) ×1 IMPLANT
SUT VIC AB 3-0 SH 18 (SUTURE) IMPLANT
SUT VIC AB 4-0 PS2 27 (SUTURE) ×6 IMPLANT
SUT VICRYL 0 UR6 27IN ABS (SUTURE) ×3 IMPLANT
SUTURE V-LC BRB 180 2/0GR6GS22 (SUTURE) IMPLANT
SYR 10ML ECCENTRIC (SYRINGE) ×3 IMPLANT
SYS LAPSCP GELPORT 120MM (MISCELLANEOUS)
SYSTEM LAPSCP GELPORT 120MM (MISCELLANEOUS) IMPLANT
TOWEL OR 17X26 10 PK STRL BLUE (TOWEL DISPOSABLE) IMPLANT
TOWEL OR NON WOVEN STRL DISP B (DISPOSABLE) ×3 IMPLANT
TRAY FOLEY MTR SLVR 16FR STAT (SET/KITS/TRAYS/PACK) ×3 IMPLANT
TROCAR ADV FIXATION 5X100MM (TROCAR) ×3 IMPLANT
TUBING CONNECTING 10 (TUBING) ×4 IMPLANT
TUBING CONNECTING 10' (TUBING) ×2
TUBING INSUFFLATION 10FT LAP (TUBING) ×3 IMPLANT

## 2020-07-31 NOTE — Op Note (Signed)
07/31/2020  11:44 AM  PATIENT:  Natalie Howard  69 y.o. female  Patient Care Team: Gwenyth Bender, MD as PCP - General (Internal Medicine) York Spaniel, MD as Consulting Physician (Neurology) Karie Soda, MD as Consulting Physician (General Surgery) Charlott Rakes, MD as Consulting Physician (Gastroenterology) Andria Meuse, MD as Consulting Physician (General Surgery)  PRE-OPERATIVE DIAGNOSIS:  Diverticular stricture  POST-OPERATIVE DIAGNOSIS:  Diverticular stricture  PROCEDURE:   ROBOTIC ASSISTED SIGMOIDECTOMY AND LEFT SALPINGECTOMY INTRAOPERATIVE ASSESSMENT OF VASCULAR PERFUSION    Surgeon(s): Romie Levee, MD Andria Meuse, MD  ASSISTANT: Dr Cliffton Asters   ANESTHESIA:   local and general  EBL: 59ml Total I/O In: 1000 [I.V.:1000] Out: 50 [Blood:50]  Delay start of Pharmacological VTE agent (>24hrs) due to surgical blood loss or risk of bleeding:  no  DRAINS: (37F) Jackson-Pratt drain(s) with closed bulb suction in the pelvis   SPECIMEN:  Source of Specimen:  Rectosigmoid colon  DISPOSITION OF SPECIMEN:  PATHOLOGY  COUNTS:  YES  PLAN OF CARE: Admit to inpatient   PATIENT DISPOSITION:  PACU - hemodynamically stable.  INDICATION:    69 y.o. F with benign appearing sigmoid stricture.  I recommended segmental resection:  The anatomy & physiology of the digestive tract was discussed.  The pathophysiology was discussed.  Natural history risks without surgery was discussed.   I worked to give an overview of the disease and the frequent need to have multispecialty involvement.  I feel the risks of no intervention will lead to serious problems that outweigh the operative risks; therefore, I recommended a partial colectomy to remove the pathology.  Laparoscopic & open techniques were discussed.   Risks such as bleeding, infection, abscess, leak, reoperation, possible ostomy, hernia, heart attack, death, and other risks were discussed.  I noted a  good likelihood this will help address the problem.   Goals of post-operative recovery were discussed as well.    The patient expressed understanding & wished to proceed with surgery.  OR FINDINGS:   Patient had significant L pelvic sidewall adhesions  The anastomosis rests ~12 cm from the anal verge by rigid proctoscopy.  DESCRIPTION:   Informed consent was confirmed.  The patient underwent general anaesthesia without difficulty.  The patient was positioned appropriately.  VTE prevention in place.  The patient's abdomen was clipped, prepped, & draped in a sterile fashion.  Surgical timeout confirmed our plan.  The patient was positioned in reverse Trendelenburg.  Abdominal entry was gained using a Varies in the LUQ.  Entry was clean.  I induced carbon dioxide insufflation.  An 40mm robotic port was placed in the RUQ.  Camera inspection revealed no injury.  Extra ports were carefully placed under direct laparoscopic visualization.  I laparoscopically reflected the greater omentum and the upper abdomen the small bowel in the upper abdomen. The patient was appropriately positioned and the robot was docked to the patient's left side.  Instruments were placed under direct visualization.    I attempted to mobilize the sigmoid colon off of the pelvic sidewall.  There was significant adhesions to the left side of the uterus and left fallopian tube.  I dissected underneath of this using the robotic vessel sealer and blunt dissection.  I was unable to free it completely.  I came up above the level of stricture in the pelvic brim and bluntly dissected into the peritoneal reflection.  I was able to find the left ureter laterally.  I dissected medial to this down to a level  of dense fibrotic tissue.  At this point, I scored the base of peritoneum of the right side of the mesentery of the left colon from the ligament of Treitz to the peritoneal reflection of the mid rectum.  The patient had tattoo noted in the  distal sigmoid colon.  I elevated the sigmoid mesentery and enetered into the retro-mesenteric plane. We were able to identify the left ureter and gonadal vessels. We kept those posterior within the retroperitoneum and elevated the left colon mesentery off that. I did isolated IMA pedicle but did not ligate it yet.  I continued distally and got into the avascular plane posterior to the mesorectum. This allowed me to help mobilize the rectum as well by freeing the mesorectum off the sacrum.  I mobilized the peritoneal coverings towards the peritoneal reflection on both the right and left sides of the rectum.  I could see the right and left ureters and stayed away from them.    I skeletonized the inferior mesenteric artery pedicle.  After confirming the left ureter was out of the way, I went ahead and ligated the inferior mesenteric artery pedicle with bipolar robotic vessel sealer well above its takeoff from the aorta.   I did ligate the inferior mesenteric vein in a similar fashion.  We ensured hemostasis. I skeletonized the mesorectum at the junction at the proximal rectum using blunt dissection & bipolar robotic vessel sealer.  I then divided the remainder of the sigmoid colon away from the left pelvic sidewall making sure to preserve the ureter all the way down.  Once all of this was free it was clear that the left fallopian tube was devascularized.  This was resected high above the level of the ureters using the robotic vessel sealer.  The left ovary appeared intact and perfused.  I mobilized the left colon in a lateral to medial fashion off the line of Toldt up towards the splenic flexure to ensure good mobilization of the left colon to reach into the pelvis.  I divided the mesentery at the level of the descending sigmoid junction using the robotic vessel sealer.  I then transected the rectosigmoid junction using a green load robotic stapler.  At this point the robot was undocked.  We enlarge the suprapubic  port to a Pfannenstiel incision and placed an Alexis wound protector.  The colon was brought out through this and transected over a pursestring device.  A 29 mm EEA anvil was placed into the open colon and the pursestring was tied tightly around this.  This is then placed back into the abdomen.  There was an area of oozing along the left uterine wall.  This was packed with a sponge.  After removing the sponge there was no further signs of oozing.  We placed the anvil back into the abdomen and placed the cap on the Alexis wound protector.  A EEA stapler was inserted into the rectum and brought out through the distal rectal stump.  The anastomosis was created without difficulty.  There was a bridge of tissue between the rectum transection staple line and the EEA staple line.  I was concerned for vascular perfusion.  Indocyanine green was injected intravascularly to evaluate for perfusion under the robotic camera.  There was good perfusion of the bridge of tissue between the staple line.  There was good perfusion of the rectum and colon noted as well.  I decided to place a 1 [place] Blake drain in the pelvis.  This was brought  out through the right lower quadrant port site and secured into place with a 2-0 nylon suture.  We then removed the ports and switch to clean gowns, gloves, instruments and drapes.  The peritoneum of the Pfannenstiel incision was closed using a running 2-0 Vicryl suture.  The fascia was closed using interrupted #1 Novafil sutures.  Subcutaneous tissue was reapproximated using a running 2-0 Vicryl suture.  The skin was closed using a running 4-0 Vicryl subcuticular suture.  The remaining port sites were closed using interrupted subcuticular 4-0 Vicryl sutures.  Dermabond was placed over these.  A sterile dressing was placed over the Pfannenstiel incision.  The patient was then awakened from anesthesia and sent to the postanesthesia care unit in stable condition.  All counts were correct per  operating room staff.   An MD assistant was necessary for tissue manipulation, retraction and positioning due to the complexity of the case and hospital policies

## 2020-07-31 NOTE — Transfer of Care (Signed)
Immediate Anesthesia Transfer of Care Note  Patient: Ryiah Bellissimo  Procedure(s) Performed: ROBOTIC ASSISTED sigmoidectomy WITH RIGID PROCTOSCOPY AND LEFT SALPINGECTOMY (N/A )  Patient Location: PACU  Anesthesia Type:General  Level of Consciousness: awake, alert , oriented and patient cooperative  Airway & Oxygen Therapy: Patient Spontanous Breathing and Patient connected to face mask oxygen  Post-op Assessment: Report given to RN, Post -op Vital signs reviewed and stable and Patient moving all extremities  Post vital signs: Reviewed and stable  Last Vitals:  Vitals Value Taken Time  BP 150/94 07/31/20 1150  Temp    Pulse 100 07/31/20 1152  Resp 16 07/31/20 1152  SpO2 100 % 07/31/20 1152  Vitals shown include unvalidated device data.  Last Pain:  Vitals:   07/31/20 0551  TempSrc: Oral      Patients Stated Pain Goal: 4 (07/31/20 0612)  Complications: No complications documented.

## 2020-07-31 NOTE — Anesthesia Postprocedure Evaluation (Signed)
Anesthesia Post Note  Patient: Natalie Howard  Procedure(s) Performed: ROBOTIC ASSISTED sigmoidectomy WITH RIGID PROCTOSCOPY AND LEFT SALPINGECTOMY (N/A )     Patient location during evaluation: PACU Anesthesia Type: General Level of consciousness: awake and alert Pain management: pain level controlled Vital Signs Assessment: post-procedure vital signs reviewed and stable Respiratory status: spontaneous breathing, nonlabored ventilation and respiratory function stable Cardiovascular status: blood pressure returned to baseline and stable Postop Assessment: no apparent nausea or vomiting Anesthetic complications: no   No complications documented.  Last Vitals:  Vitals:   07/31/20 1230 07/31/20 1255  BP: 127/78 108/74  Pulse: 84 89  Resp: 16 16  Temp: 36.9 C 36.7 C  SpO2: 100% 100%    Last Pain:  Vitals:   07/31/20 1331  TempSrc:   PainSc: 8                  Lowella Curb

## 2020-07-31 NOTE — Anesthesia Procedure Notes (Signed)
Procedure Name: Intubation Date/Time: 07/31/2020 8:47 AM Performed by: Mitzie Na, CRNA Pre-anesthesia Checklist: Patient identified, Emergency Drugs available, Suction available and Patient being monitored Patient Re-evaluated:Patient Re-evaluated prior to induction Oxygen Delivery Method: Circle system utilized Preoxygenation: Pre-oxygenation with 100% oxygen Induction Type: IV induction Ventilation: Mask ventilation without difficulty Laryngoscope Size: Mac and 3 Grade View: Grade I Tube type: Oral Tube size: 7.0 mm Number of attempts: 1 Airway Equipment and Method: Stylet Placement Confirmation: ETT inserted through vocal cords under direct vision,  positive ETCO2 and breath sounds checked- equal and bilateral Secured at: 22 cm Tube secured with: Tape Dental Injury: Teeth and Oropharynx as per pre-operative assessment

## 2020-07-31 NOTE — Anesthesia Preprocedure Evaluation (Signed)
Anesthesia Evaluation  Patient identified by MRN, date of birth, ID band Patient awake    Reviewed: Allergy & Precautions, NPO status , Patient's Chart, lab work & pertinent test results  History of Anesthesia Complications Negative for: history of anesthetic complications  Airway Mallampati: III  TM Distance: >3 FB Neck ROM: Full    Dental  (+) Dental Advisory Given, Missing   Pulmonary neg pulmonary ROS, neg recent URI, former smoker,    breath sounds clear to auscultation       Cardiovascular (-) hypertension(-) angina(-) Past MI and (-) CHF (-) dysrhythmias  Rhythm:Regular     Neuro/Psych PSYCHIATRIC DISORDERS Depression    GI/Hepatic Neg liver ROS, Abdominal pain; Hematochezia   Endo/Other  diabetes, Type 2  Renal/GU negative Renal ROS     Musculoskeletal   Abdominal   Peds  Hematology  (+) Blood dyscrasia, anemia ,   Anesthesia Other Findings   Reproductive/Obstetrics                             Anesthesia Physical  Anesthesia Plan  ASA: II  Anesthesia Plan: General   Post-op Pain Management:    Induction: Intravenous  PONV Risk Score and Plan: 3 and Treatment may vary due to age or medical condition, Ondansetron, Dexamethasone and Midazolam  Airway Management Planned: Oral ETT  Additional Equipment: None  Intra-op Plan:   Post-operative Plan:   Informed Consent: I have reviewed the patients History and Physical, chart, labs and discussed the procedure including the risks, benefits and alternatives for the proposed anesthesia with the patient or authorized representative who has indicated his/her understanding and acceptance.     Dental advisory given  Plan Discussed with: CRNA  Anesthesia Plan Comments:         Anesthesia Quick Evaluation

## 2020-07-31 NOTE — H&P (Signed)
The patient is a 69 year old female who presents with diverticulitis. 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 Patient had a normal colonoscopy approximately 3 years ago. She developed some pain in her left lower quadrant approximately one year ago. In May 2021, she developed worsening pain and was seen in the emergency department. A colocolonic fistula was noted on her CT scan. She underwent a flexible sigmoidoscopy with [name]. He was unable to traverse the area of question, but the sigmoid showed signs of inflammation and purulence. She continues to have abdominal pain and is taking oxycodone and MiraLAX for this. C-sections have been her only abdominal surgery. She has no major medical problems.   Past Surgical History  Cesarean Section - 1 Foot Surgery Bilateral. Oral Surgery  Diagnostic Studies History  Colonoscopy never Mammogram within last year Pap Smear 1-5 years ago  Allergies  Penicillins Allergies Reconciled  Medication History  oxyCODONE HCl (5MG  Tablet, Oral) Active. Paxil (10MG  Tablet, Oral) Active. Medications Reconciled  Pregnancy / Birth History  Age at menarche 14 years. Age of menopause 6-60 Gravida 1 Length (months) of breastfeeding 3-6 Maternal age 60-40 Para 1  Other Problems  Depression Diabetes Mellitus     Review of Systems  General Present- Fatigue and Fever. Not Present- Appetite Loss, Chills, Night Sweats, Weight Gain and Weight Loss. Skin Not Present- Change in Wart/Mole, Dryness, Hives, Jaundice, New Lesions, Non-Healing Wounds, Rash and Ulcer. HEENT Not Present- Earache, Hearing Loss, Hoarseness, Nose Bleed, Oral Ulcers, Ringing in the Ears, Seasonal Allergies, Sinus Pain, Sore Throat, Visual Disturbances, Wears glasses/contact lenses and Yellow Eyes. Breast Not Present- Breast Mass, Breast Pain, Nipple Discharge and Skin  Changes. Cardiovascular Present- Shortness of Breath. Not Present- Chest Pain, Difficulty Breathing Lying Down, Leg Cramps, Palpitations, Rapid Heart Rate and Swelling of Extremities. Gastrointestinal Present- Abdominal Pain, Bloody Stool, Change in Bowel Habits, Excessive gas and Nausea. Not Present- Bloating, Chronic diarrhea, Constipation, Difficulty Swallowing, Gets full quickly at meals, Hemorrhoids, Indigestion, Rectal Pain and Vomiting. Female Genitourinary Not Present- Frequency, Nocturia, Painful Urination, Pelvic Pain and Urgency. Musculoskeletal Present- Back Pain. Not Present- Joint Pain, Joint Stiffness, Muscle Pain, Muscle Weakness and Swelling of Extremities. Neurological Present- Headaches. Not Present- Decreased Memory, Fainting, Numbness, Seizures, Tingling, Tremor, Trouble walking and Weakness. Psychiatric Present- Depression. Not Present- Anxiety, Bipolar, Change in Sleep Pattern, Fearful and Frequent crying. Hematology Present- Persistent Infections. Not Present- Blood Thinners, Easy Bruising, Excessive bleeding, Gland problems and HIV.  BP 107/71   Pulse 84   Temp 98.8 F (37.1 C) (Oral)   Resp 16   Ht 5\' 5"  (1.651 m)   Wt 75.5 kg   SpO2 100%   BMI 27.69 kg/m    Physical Exam   General Mental Status-Alert. General Appearance-Cooperative. CV: RRR Lungs:CTA Abdomen Palpation/Percussion Palpation and Percussion of the abdomen reveal - Soft. Note: Tender to palpation in left lower quadrant.    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 Patient had a colonoscopy approximately 3 years ago which was normal. Flexible sigmoidoscopy last month also showed no sign of malignancy, although Dr. was unable to get all the way through. CT scan reviewed. I have recommended a elective surgery in mid August  with robotic resection and firefly injection prior to surgery. If she continues to have worsening symptoms, she should report to the emergency  department for earlier evaluation for surgery. Risks of surgery include: Bleeding, deep abdominal infections and possible wound complications such as hernia and infection, damage to adjacent structures, leak of surgical connections, which can lead to other surgeries and possibly an ostomy, possible need for other procedures, such as abscess drains in radiology, prolonged fatigue/weakness or appetite loss

## 2020-08-01 LAB — BASIC METABOLIC PANEL
Anion gap: 6 (ref 5–15)
BUN: 11 mg/dL (ref 8–23)
CO2: 25 mmol/L (ref 22–32)
Calcium: 8.7 mg/dL — ABNORMAL LOW (ref 8.9–10.3)
Chloride: 105 mmol/L (ref 98–111)
Creatinine, Ser: 1.01 mg/dL — ABNORMAL HIGH (ref 0.44–1.00)
GFR calc Af Amer: >60 mL/min (ref >60)
GFR calc non Af Amer: 58 mL/min — ABNORMAL LOW (ref >60)
Glucose, Bld: 154 mg/dL — ABNORMAL HIGH (ref 70–99)
Potassium: 4.1 mmol/L (ref 3.5–5.1)
Sodium: 136 mmol/L (ref 135–145)

## 2020-08-01 LAB — CBC
HCT: 30.6 % — ABNORMAL LOW (ref 36.0–46.0)
Hemoglobin: 9.9 g/dL — ABNORMAL LOW (ref 12.0–15.0)
MCH: 27.2 pg (ref 26.0–34.0)
MCHC: 32.4 g/dL (ref 30.0–36.0)
MCV: 84.1 fL (ref 80.0–100.0)
Platelets: 224 10*3/uL (ref 150–400)
RBC: 3.64 MIL/uL — ABNORMAL LOW (ref 3.87–5.11)
RDW: 14.5 % (ref 11.5–15.5)
WBC: 7 10*3/uL (ref 4.0–10.5)
nRBC: 0 % (ref 0.0–0.2)

## 2020-08-01 LAB — SURGICAL PATHOLOGY

## 2020-08-01 MED ORDER — METHOCARBAMOL 1000 MG/10ML IJ SOLN
500.0000 mg | Freq: Four times a day (QID) | INTRAVENOUS | Status: DC | PRN
  Administered 2020-08-01 – 2020-08-02 (×2): 500 mg via INTRAVENOUS
  Filled 2020-08-01: qty 5
  Filled 2020-08-01 (×2): qty 500

## 2020-08-01 MED ORDER — SODIUM CHLORIDE 0.9 % IV BOLUS
1000.0000 mL | Freq: Once | INTRAVENOUS | Status: AC
  Administered 2020-08-01: 1000 mL via INTRAVENOUS

## 2020-08-01 MED ORDER — KETOROLAC TROMETHAMINE 15 MG/ML IJ SOLN
15.0000 mg | Freq: Four times a day (QID) | INTRAMUSCULAR | Status: DC
  Administered 2020-08-01 – 2020-08-03 (×8): 15 mg via INTRAVENOUS
  Filled 2020-08-01 (×9): qty 1

## 2020-08-01 MED ORDER — GABAPENTIN 300 MG PO CAPS
300.0000 mg | ORAL_CAPSULE | Freq: Three times a day (TID) | ORAL | Status: DC
  Administered 2020-08-01 – 2020-08-03 (×6): 300 mg via ORAL
  Filled 2020-08-01 (×6): qty 1

## 2020-08-01 NOTE — Progress Notes (Signed)
1 Day Post-Op Robotic sigmoidectomy  Subjective: C/o soreness in her right side.  Occasional nausea.  No flatus or BM  Objective: Vital signs in last 24 hours: Temp:  [98 F (36.7 C)-99.2 F (37.3 C)] 99.2 F (37.3 C) (08/19 0619) Pulse Rate:  [74-102] 89 (08/19 0619) Resp:  [14-18] 18 (08/19 0619) BP: (101-150)/(62-94) 109/88 (08/19 0619) SpO2:  [99 %-100 %] 100 % (08/19 0619)   Intake/Output from previous day: 08/18 0701 - 08/19 0700 In: 3361.4 [P.O.:1080; I.V.:2123.4; IV Piggyback:158] Out: 2650 [Urine:2400; Drains:200; Blood:50] Intake/Output this shift: No intake/output data recorded.   General appearance: alert and cooperative GI: normal findings: soft, non-distended.  Appropriately tender  Incision: no significant drainage  Lab Results:  Recent Labs    08/01/20 0508  WBC 7.0  HGB 9.9*  HCT 30.6*  PLT 224   BMET Recent Labs    08/01/20 0508  NA 136  K 4.1  CL 105  CO2 25  GLUCOSE 154*  BUN 11  CREATININE 1.01*  CALCIUM 8.7*   PT/INR No results for input(s): LABPROT, INR in the last 72 hours. ABG No results for input(s): PHART, HCO3 in the last 72 hours.  Invalid input(s): PCO2, PO2  MEDS, Scheduled . acetaminophen  1,000 mg Oral Q6H  . alvimopan  12 mg Oral BID  . Chlorhexidine Gluconate Cloth  6 each Topical Daily  . enoxaparin (LOVENOX) injection  40 mg Subcutaneous Q24H  . feeding supplement  237 mL Oral BID BM  . FLUoxetine  20 mg Oral Daily  . gabapentin  300 mg Oral TID  . ketorolac  15 mg Intravenous Q6H  . saccharomyces boulardii  250 mg Oral BID    Studies/Results: No results found.  Assessment: s/p Procedure(s): ROBOTIC ASSISTED sigmoidectomy WITH RIGID PROCTOSCOPY AND LEFT SALPINGECTOMY Patient Active Problem List   Diagnosis Date Noted  . Diverticular disease 07/31/2020  . Acute GI bleeding 04/26/2020  . 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 Diverticulitis of colon with colocolinic fistula 04/26/2020  . Acute  diverticulitis 04/26/2020  . Tenosynovitis of right wrist 10/02/2019  . Diabetes mellitus type 2 without retinopathy (HCC) 01/13/2018  . Long term current use of oral hypoglycemic drug 01/13/2018  . Nuclear sclerotic cataract of both eyes 01/13/2018  . Posterior vitreous detachment of both eyes 01/13/2018  . 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 Gait abnormality 10/29/2017  . Paresthesia 10/29/2017    Expected post op course  Plan: d/c foley  Ambulate SL IVF's Advance to fulls      LOS: 1 day     .Vanita Panda, MD [facility], [place]    08/01/2020 8:27 AM

## 2020-08-01 NOTE — Progress Notes (Signed)
Patient fired yellow mews. Patient's blood pressure 80/56 and resp 12. Patient c/o pain right side of ABD. Dr. Maisie Fus paged. MD called back with New order of NS @ 250cc/hr

## 2020-08-01 NOTE — Evaluation (Signed)
Physical Therapy Evaluation Patient Details Name: Natalie Howard MRN: [id number] DOB: 11/24/1951 Today's Date: 08/01/2020   History of Present Illness  Pt admitted with divericular stricture and now s/p sigmoidectomy and Left salpingextomy.  Pt with hx of DM  Clinical Impression  Pt admitted as above and presenting with functional mobility limitations 2* limited endurance, ambulatory balance deficits and c/o dizziness with mobility (BP following ambulation 107/70); pt attributes dizziness to pain meds.  Pt should progress to dc home with assist of dtr.    Follow Up Recommendations Home health PT    Equipment Recommendations  Rolling walker with 5" wheels;Other (comment) (Pt is also requesting a wc "bc my daughters house is large")    Recommendations for Other Services OT consult     Precautions / Restrictions Precautions Precautions: Fall Precaution Comments: Pt reports feeling lightheaded/dizzy - pain med related Restrictions Weight Bearing Restrictions: No      Mobility  Bed Mobility Overal bed mobility: Needs Assistance Bed Mobility: Supine to Sit     Supine to sit: Min assist;Mod assist     General bed mobility comments: cues to roll and assist to move sidely to sit  Transfers Overall transfer level: Needs assistance Equipment used: Rolling walker (2 wheeled) Transfers: Sit to/from Stand Sit to Stand: Min assist         General transfer comment: cues for use of UEs to self assist; physical assist to bring wt up and fwd and to control descent; up from EOB and to/from recliner  Ambulation/Gait Ambulation/Gait assistance: Min assist;+2 safety/equipment (chair follow 2* c/o dizziness) Gait Distance (Feet): 48 Feet (twice) Assistive device: Rolling walker (2 wheeled) Gait Pattern/deviations: Step-to pattern;Step-through pattern;Decreased step length - right;Decreased step length - left;Shuffle;Narrow base of support Gait velocity: decr   General Gait  Details: cues for posture and position from RW;  assist for balance and RW management  Stairs            Wheelchair Mobility    Modified Rankin (Stroke Patients Only)       Balance Overall balance assessment: Needs assistance Sitting-balance support: Feet supported;No upper extremity supported Sitting balance-Leahy Scale: Good     Standing balance support: Bilateral upper extremity supported Standing balance-Leahy Scale: Poor                               Pertinent Vitals/Pain Pain Assessment: Faces Faces Pain Scale: Hurts little more Pain Location: R abdomen with move to sitting Pain Descriptors / Indicators: Guarding;Grimacing;Sore Pain Intervention(s): Limited activity within patient's tolerance;Monitored during session;Premedicated before session;Ice applied    Home Living Family/patient expects to be discharged to:: Private residence Living Arrangements: Children Available Help at Discharge: Family Type of Home: House Home Access: Stairs to enter Entrance Stairs-Rails: Right Entrance Stairs-Number of Steps: 5+4+4 Home Layout: Two level Home Equipment: Bedside commode;Shower seat      Prior Function Level of Independence: Independent         Comments: Pt states mobility limited by pain since Memorial Day     Hand Dominance        Extremity/Trunk Assessment   Upper Extremity Assessment Upper Extremity Assessment: Overall WFL for tasks assessed    Lower Extremity Assessment Lower Extremity Assessment: Overall WFL for tasks assessed    Cervical / Trunk Assessment Cervical / Trunk Assessment: Normal  Communication   Communication: No difficulties  Cognition Arousal/Alertness: Awake/alert Behavior During Therapy: WFL for tasks assessed/performed Overall Cognitive  Status: Within Functional Limits for tasks assessed                                        General Comments      Exercises     Assessment/Plan     PT Assessment Patient needs continued PT services  PT Problem List Decreased activity tolerance;Decreased balance;Decreased mobility;Decreased knowledge of use of DME;Pain       PT Treatment Interventions DME instruction;Gait training;Stair training;Functional mobility training;Therapeutic activities;Therapeutic exercise;Patient/family education    PT Goals (Current goals can be found in the Care Plan section)  Acute Rehab PT Goals Patient Stated Goal: Regain IND PT Goal Formulation: With patient Time For Goal Achievement: 08/15/20 Potential to Achieve Goals: Good    Frequency Min 3X/week   Barriers to discharge        Co-evaluation               AM-PAC PT "6 Clicks" Mobility  Outcome Measure Help needed turning from your back to your side while in a flat bed without using bedrails?: A Little Help needed moving from lying on your back to sitting on the side of a flat bed without using bedrails?: A Lot Help needed moving to and from a bed to a chair (including a wheelchair)?: A Little Help needed standing up from a chair using your arms (e.g., wheelchair or bedside chair)?: A Little Help needed to walk in hospital room?: A Little Help needed climbing 3-5 steps with a railing? : A Lot 6 Click Score: 16    End of Session Equipment Utilized During Treatment: Oxygen;Gait belt Activity Tolerance: Patient limited by fatigue Patient left: in chair;with call bell/phone within reach;with chair alarm set;with family/visitor present Nurse Communication: Mobility status PT Visit Diagnosis: Difficulty in walking, not elsewhere classified (R26.2)    Time: 9528-4132 PT Time Calculation (min) (ACUTE ONLY): 38 min   Charges:   PT Evaluation $PT Eval Low Complexity: 1 Low PT Treatments $Gait Training: 8-22 mins        Mauro Kaufmann PT Acute Rehabilitation Services Pager [phone] Office [phone]   Cathey Fredenburg 08/01/2020, 12:41 PM

## 2020-08-02 LAB — BASIC METABOLIC PANEL
Anion gap: 7 (ref 5–15)
BUN: 12 mg/dL (ref 8–23)
CO2: 23 mmol/L (ref 22–32)
Calcium: 8.3 mg/dL — ABNORMAL LOW (ref 8.9–10.3)
Chloride: 107 mmol/L (ref 98–111)
Creatinine, Ser: 0.9 mg/dL (ref 0.44–1.00)
GFR calc Af Amer: >60 mL/min (ref >60)
GFR calc non Af Amer: >60 mL/min (ref >60)
Glucose, Bld: 96 mg/dL (ref 70–99)
Potassium: 3.9 mmol/L (ref 3.5–5.1)
Sodium: 137 mmol/L (ref 135–145)

## 2020-08-02 LAB — CBC
HCT: 26.7 % — ABNORMAL LOW (ref 36.0–46.0)
Hemoglobin: 8.7 g/dL — ABNORMAL LOW (ref 12.0–15.0)
MCH: 27.8 pg (ref 26.0–34.0)
MCHC: 32.6 g/dL (ref 30.0–36.0)
MCV: 85.3 fL (ref 80.0–100.0)
Platelets: 167 10*3/uL (ref 150–400)
RBC: 3.13 MIL/uL — ABNORMAL LOW (ref 3.87–5.11)
RDW: 14.6 % (ref 11.5–15.5)
WBC: 4.9 10*3/uL (ref 4.0–10.5)
nRBC: 0 % (ref 0.0–0.2)

## 2020-08-02 MED ORDER — OXYCODONE HCL 5 MG PO TABS: 5.0000 mg | ORAL_TABLET | ORAL | Status: DC | PRN

## 2020-08-02 MED ORDER — TRAMADOL HCL 50 MG PO TABS
50.0000 mg | ORAL_TABLET | Freq: Four times a day (QID) | ORAL | Status: DC | PRN
  Administered 2020-08-02 – 2020-08-03 (×3): 50 mg via ORAL
  Filled 2020-08-02 (×3): qty 1

## 2020-08-02 NOTE — TOC Progression Note (Signed)
Transition of Care [facility]) - Progression Note    Patient Details  Name: Natalie Howard MRN: [id number] Date of Birth: 05/01/1951  Transition of Care [facility]) CM/SW Contact  Golda Acre, RN Phone Number: 08/02/2020, 1:32 PM  Clinical Narrative:    Pt and ot -hhc through [facility].   Expected Discharge Plan: Home w Home Health Services Barriers to Discharge: Continued Medical Work up  Expected Discharge Plan and Services Expected Discharge Plan: Home w Home Health Services   Discharge Planning Services: CM Consult Post Acute Care Choice: Home Health Living arrangements for the past 2 months: Single Family Home                           HH Arranged: PT, OT HH Agency: [facility] Date HH Agency Contacted: 08/02/20 Time HH Agency Contacted: 1331 Representative spoke with at [facility] Agency: amy   Social Determinants of Health (SDOH) Interventions    Readmission Risk Interventions No flowsheet data found.

## 2020-08-02 NOTE — Evaluation (Signed)
Occupational Therapy Evaluation Patient Details Name: Natalie Howard MRN: [id number] DOB: 1951/04/11 Today's Date: 08/02/2020    History of Present Illness Pt admitted with divericular stricture and now s/p sigmoidectomy and Left salpingextomy.  Pt with hx of DM   Clinical Impression   [name] is a 69 year old woman s/p sigmoidectomy who demonstrates ability to perform ADLs, bed mobility and ambulation with increased time. Patient instructed on use of log roll technique for bed mobility to reduce abdominal discomfort and instructed patient on energy conservation principles for return home. Patient verbalized understanding. No further OT needs at this time.    Follow Up Recommendations  No OT follow up    Equipment Recommendations       Recommendations for Other Services       Precautions / Restrictions Precautions Precautions: None Restrictions Weight Bearing Restrictions: No      Mobility Bed Mobility Overal bed mobility: Modified Independent             General bed mobility comments: Assistance of bed rails and verbal cues for log roll technique to reduce abdominal discomfort but no physical assistance.  Transfers Overall transfer level: Independent               General transfer comment: Patient indepenent to ambulate in room and perform toilet transfer. RN and patient reports ambulating in room without nursing assistance.    Balance Overall balance assessment: Independent                                         ADL either performed or assessed with clinical judgement   ADL Overall ADL's : Modified independent                                       General ADL Comments: Increased time for ADLs but independent with dressing, toileting, grooming, bathing and feeding.     Vision Baseline Vision/History: Wears glasses Wears Glasses: Reading only Patient Visual Report: No change from baseline        Perception     Praxis      Pertinent Vitals/Pain Pain Assessment: Faces Faces Pain Scale: Hurts a little bit Pain Location: R abdomen with move to sitting Pain Descriptors / Indicators: Guarding;Grimacing;Sore Pain Intervention(s): Monitored during session     Hand Dominance Right   Extremity/Trunk Assessment Upper Extremity Assessment Upper Extremity Assessment: Overall WFL for tasks assessed   Lower Extremity Assessment Lower Extremity Assessment: Defer to PT evaluation   Cervical / Trunk Assessment Cervical / Trunk Assessment: Normal   Communication Communication Communication: No difficulties   Cognition Arousal/Alertness: Awake/alert Behavior During Therapy: WFL for tasks assessed/performed Overall Cognitive Status: Within Functional Limits for tasks assessed                                     General Comments       Exercises     Shoulder Instructions      Home Living Family/patient expects to be discharged to:: Private residence Living Arrangements: Children Available Help at Discharge: Family Type of Home: House Home Access: Stairs to enter Secretary/administrator of Steps: 5+4+4 Entrance Stairs-Rails: Right Home Layout: Two level Alternate Level Stairs-Number of Steps: 20  Alternate Level Stairs-Rails: Right Bathroom Shower/Tub: Walk-in shower         Home Equipment: Bedside commode;Shower seat;Hospital bed          Prior Functioning/Environment Level of Independence: Independent        Comments: Pt states mobility limited by pain since Memorial Day        OT Problem List:        OT Treatment/Interventions:      OT Goals(Current goals can be found in the care plan section) Acute Rehab OT Goals OT Goal Formulation: All assessment and education complete, DC therapy  OT Frequency:     Barriers to D/C:            Co-evaluation              AM-PAC OT "6 Clicks" Daily Activity     Outcome Measure Help from  another person eating meals?: None Help from another person taking care of personal grooming?: None Help from another person toileting, which includes using toliet, bedpan, or urinal?: None Help from another person bathing (including washing, rinsing, drying)?: None Help from another person to put on and taking off regular upper body clothing?: None Help from another person to put on and taking off regular lower body clothing?: None 6 Click Score: 24   End of Session Nurse Communication: Mobility status  Activity Tolerance: Patient tolerated treatment well Patient left: in bed;with call bell/phone within reach                   Time: 1610-9604 OT Time Calculation (min): 21 min Charges:  OT General Charges $OT Visit: 1 Visit OT Evaluation $OT Eval Low Complexity: 1 Low  Graham Hyun, OTR/L Acute Care Rehab Services  Office [phone] Pager: [phone]   Kelli Churn 08/02/2020, 3:52 PM

## 2020-08-02 NOTE — Care Management Important Message (Signed)
Important Message  Patient Details IM Letter given to the Patient Name: Natalie Howard MRN: [id number] Date of Birth: 23-Dec-1950   Medicare Important Message Given:  Yes     [name], [name] 08/02/2020, 12:02 PM

## 2020-08-02 NOTE — Progress Notes (Signed)
2 Days Post-Op Robotic sigmoidectomy  Subjective: C/o less pain.  Having flatus and BM's  Objective: Vital signs in last 24 hours: Temp:  [97.9 F (36.6 C)-98.5 F (36.9 C)] 98.5 F (36.9 C) (08/20 0647) Pulse Rate:  [70-97] 80 (08/20 0647) Resp:  [12-18] 18 (08/20 0647) BP: (80-140)/(50-89) 108/68 (08/20 0647) SpO2:  [95 %-100 %] 99 % (08/20 0647) Weight:  [75.4 kg] 75.4 kg (08/20 0647)   Intake/Output from previous day: 08/19 0701 - 08/20 0700 In: 1067 [P.O.:717; I.V.:300; IV Piggyback:50] Out: 145 [Urine:145] Intake/Output this shift: No intake/output data recorded.   General appearance: alert and cooperative GI: normal findings: soft, non-distended.  Appropriately tender  Incision: no significant drainage  Lab Results:  Recent Labs    08/01/20 0508 08/02/20 0438  WBC 7.0 4.9  HGB 9.9* 8.7*  HCT 30.6* 26.7*  PLT 224 167   BMET Recent Labs    08/01/20 0508 08/02/20 0438  NA 136 137  K 4.1 3.9  CL 105 107  CO2 25 23  GLUCOSE 154* 96  BUN 11 12  CREATININE 1.01* 0.90  CALCIUM 8.7* 8.3*   PT/INR No results for input(s): LABPROT, INR in the last 72 hours. ABG No results for input(s): PHART, HCO3 in the last 72 hours.  Invalid input(s): PCO2, PO2  MEDS, Scheduled . acetaminophen  1,000 mg Oral Q6H  . Chlorhexidine Gluconate Cloth  6 each Topical Daily  . enoxaparin (LOVENOX) injection  40 mg Subcutaneous Q24H  . feeding supplement  237 mL Oral BID BM  . FLUoxetine  20 mg Oral Daily  . gabapentin  300 mg Oral TID  . ketorolac  15 mg Intravenous Q6H  . saccharomyces boulardii  250 mg Oral BID    Studies/Results: No results found.  Assessment: s/p Procedure(s): ROBOTIC ASSISTED sigmoidectomy WITH RIGID PROCTOSCOPY AND LEFT SALPINGECTOMY Patient Active Problem List   Diagnosis Date Noted  . Diverticular disease 07/31/2020  . Acute GI bleeding 04/26/2020  . 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 Diverticulitis of colon with colocolinic fistula  04/26/2020  . Acute diverticulitis 04/26/2020  . Tenosynovitis of right wrist 10/02/2019  . Diabetes mellitus type 2 without retinopathy (HCC) 01/13/2018  . Long term current use of oral hypoglycemic drug 01/13/2018  . Nuclear sclerotic cataract of both eyes 01/13/2018  . Posterior vitreous detachment of both eyes 01/13/2018  . 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 Gait abnormality 10/29/2017  . Paresthesia 10/29/2017    Expected post op course  Plan: Ambulate SL IVF's Advance to soft diet  Anticipate d/c in AM    LOS: 2 days     .Vanita Panda, MD [facility], [place]    08/02/2020 7:42 AM

## 2020-08-03 LAB — CBC
HCT: 25.5 % — ABNORMAL LOW (ref 36.0–46.0)
Hemoglobin: 8.3 g/dL — ABNORMAL LOW (ref 12.0–15.0)
MCH: 27.5 pg (ref 26.0–34.0)
MCHC: 32.5 g/dL (ref 30.0–36.0)
MCV: 84.4 fL (ref 80.0–100.0)
Platelets: 188 10*3/uL (ref 150–400)
RBC: 3.02 MIL/uL — ABNORMAL LOW (ref 3.87–5.11)
RDW: 14.7 % (ref 11.5–15.5)
WBC: 5.2 10*3/uL (ref 4.0–10.5)
nRBC: 0 % (ref 0.0–0.2)

## 2020-08-03 LAB — BASIC METABOLIC PANEL
Anion gap: 6 (ref 5–15)
BUN: 14 mg/dL (ref 8–23)
CO2: 25 mmol/L (ref 22–32)
Calcium: 8.4 mg/dL — ABNORMAL LOW (ref 8.9–10.3)
Chloride: 109 mmol/L (ref 98–111)
Creatinine, Ser: 0.78 mg/dL (ref 0.44–1.00)
GFR calc Af Amer: >60 mL/min (ref >60)
GFR calc non Af Amer: >60 mL/min (ref >60)
Glucose, Bld: 107 mg/dL — ABNORMAL HIGH (ref 70–99)
Potassium: 3.7 mmol/L (ref 3.5–5.1)
Sodium: 140 mmol/L (ref 135–145)

## 2020-08-03 MED ORDER — OXYCODONE HCL 5 MG PO TABS
5.0000 mg | ORAL_TABLET | Freq: Four times a day (QID) | ORAL | 0 refills | Status: AC | PRN
Start: 2020-08-03 — End: ?

## 2020-08-03 MED ORDER — OXYCODONE HCL 5 MG PO TABS
5.0000 mg | ORAL_TABLET | Freq: Four times a day (QID) | ORAL | 0 refills | Status: DC | PRN
Start: 2020-08-03 — End: 2020-08-03

## 2020-08-03 NOTE — Discharge Summary (Signed)
Physician Discharge Summary  Patient ID: Natalie Howard MRN: [id number] DOB/AGE: 04/25/1951 69 y.o.  Admit date: 07/31/2020 Discharge date: 08/03/2020  Admission Diagnoses: Diverticular stricture  Discharge Diagnoses:  Active Problems:   Diverticular disease   Discharged Condition: good  Hospital Course: Patient admitted after surgery.  Diet was advanced as tolerated.  By POD 3 she was eating well, having bowel function and her pain was controlled with PO meds.  She was felt to be in stable condition for d/c home  Consults: None  Significant Diagnostic Studies: labs: cbc, bmet  Treatments: IV hydration, analgesia: Dilaudid and oxycodone and surgery: Robotic Sigmoidectomy  Discharge Exam: Blood pressure 136/79, pulse 79, temperature 98.9 F (37.2 C), temperature source Oral, resp. rate 16, height 5\' 5"  (1.651 m), weight 75.4 kg, SpO2 99 %. General appearance: alert and cooperative GI: soft, non-distended.  JP with SSF Incision/Wound: clean dry intact  Disposition: Discharge disposition: 01-Home or Self Care        Allergies as of 08/03/2020      Reactions   Epitol [carbamazepine] Itching   Gluten Meal    Unknown   Mold Extract [trichophyton]    Runny nose    Penicillins Hives      Medication List    STOP taking these medications   polyethylene glycol 17 g packet Commonly known as: MIRALAX / GLYCOLAX     TAKE these medications   ammonium lactate 12 % lotion Commonly known as: LAC-HYDRIN Apply 1 application topically as needed for dry skin.   FLUoxetine 20 MG capsule Commonly known as: PROZAC Take 20 mg by mouth daily.   oxyCODONE 5 MG immediate release tablet Commonly known as: Oxy IR/ROXICODONE Take 1 tablet (5 mg total) by mouth 4 (four) times daily as needed.       Follow-up Information    08/05/2020, MD. Schedule an appointment as soon as possible for a visit in 2 week(s).   Specialty: General Surgery Contact information: [address] [place] [phone]               Signed: [phone] 08/03/2020, 8:28 AM

## 2020-08-03 NOTE — Progress Notes (Signed)
Found pt sitting on the floor trying to change the tv station. I asked her what happened and she said she sat on the foot rest of the recliner and she ended up in the floor.  Denied she was hurt, MD was notified.

## 2020-08-03 NOTE — Progress Notes (Signed)
Pt alert and oriented, tolerating diet. D/C instructions given to her and her daughter. Pt d/cd home.

## 2020-08-03 NOTE — Discharge Instructions (Signed)

## 2020-10-11 ENCOUNTER — Other Ambulatory Visit: Payer: Self-pay | Admitting: Family Medicine

## 2020-10-11 DIAGNOSIS — Z1231 Encounter for screening mammogram for malignant neoplasm of breast: Secondary | ICD-10-CM

## 2020-11-22 ENCOUNTER — Ambulatory Visit
Admission: RE | Admit: 2020-11-22 | Discharge: 2020-11-22 | Disposition: A | Discharge status: Home / Self Care | Payer: Medicare Other | Source: Ambulatory Visit | Attending: Family Medicine | Admitting: Family Medicine

## 2020-11-22 ENCOUNTER — Other Ambulatory Visit: Payer: Self-pay

## 2020-11-22 DIAGNOSIS — Z1231 Encounter for screening mammogram for malignant neoplasm of breast: Secondary | ICD-10-CM

## 2021-10-16 ENCOUNTER — Other Ambulatory Visit: Payer: Self-pay | Admitting: Family Medicine

## 2021-10-16 DIAGNOSIS — Z1231 Encounter for screening mammogram for malignant neoplasm of breast: Secondary | ICD-10-CM

## 2021-11-28 ENCOUNTER — Ambulatory Visit: Payer: Medicare Other
